# Patient Record
Sex: Female | Born: 1975
Health system: Southern US, Community
[De-identification: ages and names within clinical notes are randomized; demographics above are authoritative.]

## PROBLEM LIST (undated history)

## (undated) DIAGNOSIS — I498 Other specified cardiac arrhythmias: Secondary | ICD-10-CM

## (undated) DIAGNOSIS — I951 Orthostatic hypotension: Secondary | ICD-10-CM

## (undated) DIAGNOSIS — R000 Tachycardia, unspecified: Secondary | ICD-10-CM

## (undated) DIAGNOSIS — G90A Postural orthostatic tachycardia syndrome (POTS): Secondary | ICD-10-CM

## (undated) DIAGNOSIS — IMO0002 Reserved for concepts with insufficient information to code with codable children: Secondary | ICD-10-CM

## (undated) DIAGNOSIS — N35919 Unspecified urethral stricture, male, unspecified site: Secondary | ICD-10-CM

## (undated) DIAGNOSIS — N390 Urinary tract infection, site not specified: Secondary | ICD-10-CM

## (undated) DIAGNOSIS — I809 Phlebitis and thrombophlebitis of unspecified site: Secondary | ICD-10-CM

## (undated) DIAGNOSIS — I4711 Inappropriate sinus tachycardia, so stated: Secondary | ICD-10-CM

## (undated) DIAGNOSIS — Z86718 Personal history of other venous thrombosis and embolism: Secondary | ICD-10-CM

## (undated) DIAGNOSIS — G8929 Other chronic pain: Secondary | ICD-10-CM

## (undated) DIAGNOSIS — M549 Dorsalgia, unspecified: Secondary | ICD-10-CM

## (undated) HISTORY — DX: Postural orthostatic tachycardia syndrome (POTS): G90.A

## (undated) HISTORY — DX: Other specified cardiac arrhythmias: I49.8

## (undated) HISTORY — DX: Tachycardia, unspecified: R00.0

## (undated) HISTORY — DX: Dorsalgia, unspecified: M54.9

## (undated) HISTORY — DX: Other chronic pain: G89.29

## (undated) HISTORY — DX: Unspecified urethral stricture, male, unspecified site: N35.919

## (undated) HISTORY — DX: Personal history of other venous thrombosis and embolism: Z86.718

## (undated) HISTORY — DX: Urinary tract infection, site not specified: N39.0

## (undated) HISTORY — DX: Phlebitis and thrombophlebitis of unspecified site: I80.9

## (undated) HISTORY — DX: Orthostatic hypotension: I95.1

## (undated) HISTORY — DX: Reserved for concepts with insufficient information to code with codable children: IMO0002

## (undated) HISTORY — DX: Inappropriate sinus tachycardia, so stated: I47.11

---

## 2012-11-15 HISTORY — PX: OTHER SURGICAL HISTORY: SHX169

## 2012-12-15 HISTORY — PX: LAMINECTOMY: SHX219

## 2013-03-17 HISTORY — PX: SPINAL FUSION: SHX223

## 2013-05-17 DIAGNOSIS — Z86718 Personal history of other venous thrombosis and embolism: Secondary | ICD-10-CM

## 2013-05-17 HISTORY — PX: OTHER SURGICAL HISTORY: SHX169

## 2013-05-17 HISTORY — DX: Personal history of other venous thrombosis and embolism: Z86.718

## 2015-05-18 LAB — HM PAP SMEAR: HM Pap smear: NEGATIVE

## 2017-11-23 LAB — HM MAMMOGRAPHY

## 2018-03-18 DIAGNOSIS — M9901 Segmental and somatic dysfunction of cervical region: Secondary | ICD-10-CM | POA: Diagnosis not present

## 2018-03-18 DIAGNOSIS — M79605 Pain in left leg: Secondary | ICD-10-CM | POA: Diagnosis not present

## 2018-03-18 DIAGNOSIS — M545 Low back pain: Secondary | ICD-10-CM | POA: Diagnosis not present

## 2018-04-07 ENCOUNTER — Ambulatory Visit: Payer: 59 | Admitting: Adult Health

## 2018-04-07 ENCOUNTER — Telehealth: Payer: Self-pay | Admitting: Adult Health

## 2018-04-07 ENCOUNTER — Encounter: Payer: Self-pay | Admitting: Adult Health

## 2018-04-07 VITALS — BP 98/60 | HR 85 | Temp 98.1°F | Ht 64.0 in | Wt 121.2 lb

## 2018-04-07 DIAGNOSIS — R000 Tachycardia, unspecified: Secondary | ICD-10-CM | POA: Diagnosis not present

## 2018-04-07 DIAGNOSIS — G8929 Other chronic pain: Secondary | ICD-10-CM | POA: Diagnosis not present

## 2018-04-07 DIAGNOSIS — Z7689 Persons encountering health services in other specified circumstances: Secondary | ICD-10-CM

## 2018-04-07 DIAGNOSIS — M545 Low back pain: Secondary | ICD-10-CM

## 2018-04-07 DIAGNOSIS — I951 Orthostatic hypotension: Secondary | ICD-10-CM

## 2018-04-07 DIAGNOSIS — M549 Dorsalgia, unspecified: Secondary | ICD-10-CM

## 2018-04-07 DIAGNOSIS — N35919 Unspecified urethral stricture, male, unspecified site: Secondary | ICD-10-CM | POA: Insufficient documentation

## 2018-04-07 DIAGNOSIS — G90A Postural orthostatic tachycardia syndrome (POTS): Secondary | ICD-10-CM | POA: Insufficient documentation

## 2018-04-07 DIAGNOSIS — N390 Urinary tract infection, site not specified: Secondary | ICD-10-CM | POA: Insufficient documentation

## 2018-04-07 MED ORDER — NITROFURANTOIN MACROCRYSTAL 100 MG PO CAPS: 100.0000 mg | ORAL_CAPSULE | Freq: Every day | ORAL | 3 refills | Status: DC

## 2018-04-07 MED ORDER — OLOPATADINE HCL 0.1 % OP SOLN: 1.0000 [drp] | Freq: Every day | OPHTHALMIC | 1 refills | Status: DC

## 2018-04-07 MED ORDER — GABAPENTIN 300 MG PO CAPS: 900.0000 mg | ORAL_CAPSULE | Freq: Three times a day (TID) | ORAL | 2 refills | Status: DC

## 2018-04-07 NOTE — Telephone Encounter (Signed)
Per Kandee Keenory the gabapentin should be written as 3 capsules TID.  This has been fixed by the pharmacy.  Nothing further is needed

## 2018-04-07 NOTE — Telephone Encounter (Signed)
Rx written take 3 capsules by mouth 3 times a day. Take 2 capsules at dinner and 3 capsules at bedtime.

## 2018-04-07 NOTE — Progress Notes (Signed)
Patient presents to clinic today to establish care. She is a pleasant 42 year old female who  has a past medical history of Frequent UTI, History of DVT (deep vein thrombosis) (05/2013), Inappropriate sinus tachycardia, POTS (postural orthostatic tachycardia syndrome), and Urethral spasm.   Her last CPE was in September 2018   She recently moved from [place] to [place], 5 days ago.   Acute Concerns: Establish Care   Chronic Issues: Inappropriate Sinus Tachycardia - Takes Sectral. Was seen by Cardiology in [place]. She reports being well controlled on this medication. 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   Chronic Pain s/p lumbar fusion/Laminectomy, and Microdiscectomy. Currently prescribed Dilaudid 2 mg every 6 hours, klonopin 0.5 mg BID PRN and Zanaflex for muscle spasms. She also used Gabapentin 900 mg TID  Insomnia -due to pain.  Currently prescribed restoral 15 mg nightly as needed.  She reports that this helps somewhat she is usually awake at 3 AM and is unable to get back to sleep.  She has tried Ambien in the past did not work well for her  History of DVT - happened in 2014 after L5/S1 Fusion.   Health Maintenance: Dental --Routine Care Vision -- Routine Care  Immunizations -- UTD  Colonoscopy -- Never had  Mammogram --2018  PAP -- 2018 - normal  Bone Density -- 2018    Past Medical History:  Diagnosis Date  . Frequent UTI    d/t congential abnormality   . History of DVT (deep vein thrombosis) 05/2013  . Inappropriate sinus tachycardia   . POTS (postural orthostatic tachycardia syndrome)   . Urethral spasm      Current Outpatient Medications on File Prior to Visit  Medication Sig Dispense Refill  . acebutolol (SECTRAL) 200 MG capsule Take 200 mg by mouth daily.    Marland Kitchen amitriptyline (ELAVIL) 25 MG tablet Take 25 mg by mouth at  bedtime.    . clonazePAM (KLONOPIN) 0.5 MG tablet Take 0.5 mg by mouth 2 (two) times daily as needed for anxiety.    . gabapentin (NEURONTIN) 300 MG capsule Take 2 capsules at dinner and then 3 capsules at bedtime.    Marland Kitchen HYDROmorphone (DILAUDID) 2 MG tablet Take 2 mg by mouth every 6 (six) hours as needed for severe pain.    . nitrofurantoin (MACRODANTIN) 100 MG capsule Take 100 mg by mouth daily.    Marland Kitchen olopatadine (PATANOL) 0.1 % ophthalmic solution Place 1 drop into both eyes daily.    . temazepam (RESTORIL) 15 MG capsule Take 15 mg by mouth at bedtime as needed for sleep.    Marland Kitchen tiZANidine (ZANAFLEX) 4 MG capsule Take 4 mg by mouth 3 (three) times daily.     No current facility-administered medications on file prior to visit.     Allergies  Allergen Reactions  . Tamsulosin Hypertension, Photosensitivity, Rash and Shortness Of Breath  . Tramadol Anxiety, Palpitations and Shortness Of Breath  . Cephalexin Hives  . Duloxetine Hcl Other (See Comments)  . Lamotrigine Photosensitivity  . Sulfamethoxazole-Trimethoprim Hives and Palpitations    Family History  Problem Relation Age of Onset  . Early death Mother 71  . Transient ischemic attack Mother   . Depression Mother   . Mental illness Mother   . Transient ischemic attack Father   . Breast cancer Paternal Grandmother   .  Breast cancer Paternal Aunt   . Breast cancer Paternal Aunt     Social History   Socioeconomic History  . Marital status: Married    Spouse name: Not on file  . Number of children: Not on file  . Years of education: Not on file  . Highest education level: Not on file  Occupational History  . Not on file  Social Needs  . Financial resource strain: Not on file  . Food insecurity:    Worry: Not on file    Inability: Not on file  . Transportation needs:    Medical: Not on file    Non-medical: Not on file  Tobacco Use  . Smoking status: Not on file  Substance and Sexual Activity  . Alcohol use: Never     Frequency: Never  . Drug use: Never  . Sexual activity: Not on file  Lifestyle  . Physical activity:    Days per week: Not on file    Minutes per session: Not on file  . Stress: Not on file  Relationships  . Social connections:    Talks on phone: Not on file    Gets together: Not on file    Attends religious service: Not on file    Active member of club or organization: Not on file    Attends meetings of clubs or organizations: Not on file    Relationship status: Not on file  . Intimate partner violence:    Fear of current or ex partner: Not on file    Emotionally abused: Not on file    Physically abused: Not on file    Forced sexual activity: Not on file  Other Topics Concern  . Not on file  Social History Narrative   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    Two children ( 15 & 17)     Review of Systems  Constitutional: Negative.   HENT: Negative.   Respiratory: Negative.   Cardiovascular: Negative.   Gastrointestinal: Negative.   Genitourinary: Negative.   Musculoskeletal: Positive for back pain.  Skin: Negative.   Neurological: Positive for sensory change and weakness.  Psychiatric/Behavioral: The patient has insomnia.   All other systems reviewed and are negative.    BP 98/60 (BP Location: Right Arm, Patient Position: Sitting, Cuff Size: Normal)   Pulse 85   Temp 98.1 F (36.7 C) (Oral)   Ht 5\' 4"  (1.626 m)   Wt 121 lb 4 oz (55 kg)   SpO2 96%   BMI 20.81 kg/m   Physical Exam  Constitutional: She is oriented to person, place, and time. She appears well-developed and well-nourished. No distress.  Appears in pain, unable to sit in exam chair for long periods of time   Eyes: Pupils are equal, round, and reactive to light. EOM are normal.  Cardiovascular: Normal rate, regular rhythm, normal heart sounds and intact distal pulses.  Pulmonary/Chest: Effort normal and breath sounds normal.  Neurological: She is alert and oriented to person, place, and time.    Skin: Skin is warm and dry. Capillary refill takes less than 2 seconds. She is not diaphoretic.  Psychiatric: She has a normal mood and affect. Her behavior is normal. Judgment and thought content normal.  Nursing note and vitals reviewed.  Assessment/Plan: 1. Encounter to establish care - Follow up in September for CPE  - Follow up sooner if needed  2. Urethral spasm  - amitriptyline (ELAVIL) 25 MG tablet; Take 25 mg by  mouth at bedtime.  3. Chronic midline low back pain without sciatica -She was advised that I would not be providing her with any pain medication at this time.  She will need to conserve her pain medication until she is seen at the pain clinic.  She is okay with this plan.  I will also not be prescribing her Klonopin and again this will have to be prescribed by the pain clinic if they feel its appropriate - gabapentin (NEURONTIN) 300 MG capsule; Take 3 capsules (900 mg total) by mouth 3 (three) times daily. Take 2 capsules at dinner and then 3 capsules at bedtime. (Patient taking differently: Take 900 mg by mouth 3 (three) times daily. )  Dispense: 270 capsule; Refill: 2 - Ambulatory referral to Pain Clinic  4. Inappropriate sinus tachycardia  - acebutolol (SECTRAL) 200 MG capsule; Take 200 mg by mouth daily.

## 2018-04-07 NOTE — Telephone Encounter (Signed)
Copied from CRM [id number]. Topic: Quick Communication - Rx Refill/Question >> Apr 07, 2018  3:27 PM Maia Pettiesrtiz, Kristie S wrote: Medication: needing clarification on gabapentin Rx - 2 different sets of directions for use Preferred Pharmacy (with phone number or street name): [id number] [place] - [address] [facility] [address] [facility] [address] [phone] (Phone) [phone] (Fax)

## 2018-04-08 ENCOUNTER — Encounter: Payer: Self-pay | Admitting: Adult Health

## 2018-04-08 ENCOUNTER — Other Ambulatory Visit: Payer: Self-pay | Admitting: Adult Health

## 2018-04-08 MED ORDER — TIZANIDINE HCL 4 MG PO CAPS: 4.0000 mg | ORAL_CAPSULE | Freq: Every day | ORAL | 1 refills | Status: DC

## 2018-04-08 NOTE — Telephone Encounter (Signed)
I stated I didn't take Zanaflex because it didn't really help me. I completely forgot I do take it when I wake up in the middle of the night with perianal spasm. Are you able to refill that?  Cory please advise on the refill of the zanaflex.  Thanks

## 2018-04-10 ENCOUNTER — Encounter: Payer: Self-pay | Admitting: Adult Health

## 2018-04-12 ENCOUNTER — Other Ambulatory Visit: Payer: Self-pay | Admitting: Adult Health

## 2018-04-12 ENCOUNTER — Encounter: Payer: Self-pay | Admitting: Family Medicine

## 2018-04-12 DIAGNOSIS — M545 Low back pain, unspecified: Secondary | ICD-10-CM

## 2018-04-12 DIAGNOSIS — G8929 Other chronic pain: Secondary | ICD-10-CM

## 2018-04-13 ENCOUNTER — Ambulatory Visit: Payer: 59 | Admitting: Sports Medicine

## 2018-04-13 ENCOUNTER — Other Ambulatory Visit: Payer: Self-pay | Admitting: Adult Health

## 2018-04-13 ENCOUNTER — Ambulatory Visit (INDEPENDENT_AMBULATORY_CARE_PROVIDER_SITE_OTHER): Payer: 59

## 2018-04-13 ENCOUNTER — Encounter: Payer: Self-pay | Admitting: Sports Medicine

## 2018-04-13 VITALS — BP 120/78 | HR 98 | Ht 64.0 in | Wt 123.0 lb

## 2018-04-13 DIAGNOSIS — M961 Postlaminectomy syndrome, not elsewhere classified: Secondary | ICD-10-CM

## 2018-04-13 DIAGNOSIS — M9908 Segmental and somatic dysfunction of rib cage: Secondary | ICD-10-CM

## 2018-04-13 DIAGNOSIS — M545 Low back pain: Secondary | ICD-10-CM | POA: Diagnosis not present

## 2018-04-13 DIAGNOSIS — G8929 Other chronic pain: Secondary | ICD-10-CM

## 2018-04-13 DIAGNOSIS — M9902 Segmental and somatic dysfunction of thoracic region: Secondary | ICD-10-CM | POA: Diagnosis not present

## 2018-04-13 DIAGNOSIS — M9903 Segmental and somatic dysfunction of lumbar region: Secondary | ICD-10-CM

## 2018-04-13 DIAGNOSIS — M9905 Segmental and somatic dysfunction of pelvic region: Secondary | ICD-10-CM

## 2018-04-13 DIAGNOSIS — M47816 Spondylosis without myelopathy or radiculopathy, lumbar region: Secondary | ICD-10-CM | POA: Diagnosis not present

## 2018-04-13 DIAGNOSIS — M9904 Segmental and somatic dysfunction of sacral region: Secondary | ICD-10-CM

## 2018-04-13 MED ORDER — HYDROMORPHONE HCL 2 MG PO TABS: 2.0000 mg | ORAL_TABLET | Freq: Four times a day (QID) | ORAL | 0 refills | Status: AC | PRN

## 2018-04-13 MED ORDER — ALCAFTADINE 0.25 % OP SOLN: 1.0000 [drp] | Freq: Every day | OPHTHALMIC | 3 refills | Status: DC

## 2018-04-13 MED ORDER — IBUPROFEN-FAMOTIDINE 800-26.6 MG PO TABS: 1.0000 | ORAL_TABLET | Freq: Three times a day (TID) | ORAL | 0 refills | Status: AC | PRN

## 2018-04-13 NOTE — Progress Notes (Signed)
Christie Hurst. [facility] [phone]  Christie Hurst - 42 y.o. female MRN [id number]  Date of birth: 23-May-1976  Visit Date: 04/13/2018  PCP: Shirline Frees, NP   Referred by: Shirline Frees, NP  Scribe(s) for today's visit: Christoper Fabian, LAT, ATC  SUBJECTIVE:  Christie Hurst is here for New Patient (Initial Visit) (Low back pain) .  Referred by: Angela Adam, NP   Her Back pain into left leg symptoms INITIALLY:  Began 5 years after surgery and has not changed much over time Described as moderate pain, radiating to back of leg and groin.  Worsened with movement back. Leg pain is worse during the night.  Improved with some home exercises and manipulation from provider in [place]. She also has over the counter creams that have helped in the past.   Additional associated symptoms include: numbness in legs.     At this time symptoms show no change compared to onset  She has tried NSAIDs but they did not improve pain.  Injection right SI joint side effects were so bad patient would not like to try any more. Therapy. Patient has tried physical therapy a few times and it only increases the muscle spasms and she has to stop She does have open referral to pain clinic but an appointment has not been made yet.    REVIEW OF SYSTEMS: Reports night time disturbances.Pain in back and left leg wake up at night  Denies fevers, chills, or night sweats. Denies unexplained weight loss. Denies personal history of cancer. Denies changes in bowel or bladder habits. Denies recent unreported falls. Denies new or worsening dyspnea or wheezing. Denies headaches or dizziness.  Reports numbness, tingling or weakness  In the extremities. Left let history of nerve damage.  Denies dizziness or presyncopal episodes Denies lower extremity edema     HISTORY & PERTINENT PRIOR DATA:  Significant/pertinent history, findings, studies include:  reports that she has never smoked. She has never used smokeless tobacco. No results for input(s): HGBA1C, LABURIC, CREATINE in the last 8760 hours. No specialty comments available. No problems updated.  Otherwise prior history reviewed and updated per electronic medical record.    OBJECTIVE:  VS:  HT:5\' 4"  (162.6 cm)   WT:123 lb (55.8 kg)  BMI:21.1    BP:120/78  HR:98bpm  TEMP: ( )  RESP:100 %   PHYSICAL EXAM: CONSTITUTIONAL: Well-developed, Well-nourished and In no acute distress Alert & appropriately interactive. and Not depressed or anxious appearing. RESPIRATORY: No increased work of breathing and Trachea Midline EYES: Pupils are equal., EOM intact without nystagmus. and No scleral icterus.  Lower extremities: Warm and well perfused Edema: No significant swelling or edema NEURO: unremarkable Normal associated myotomal distribution strength to manual muscle testing Normal sensation to light touch Normal and symmetric associated DTRs  MSK Exam: BACK Exam: Normal alignment & Contours Skin: No overlying erythema/ecchymosis  MOTOR TESTING: Intact in all LE myotomes and Able to heel and toe walk without difficutly        RIGHT    LEFT Straight leg raise-------------------------: positive, moderate pain                         positive, moderate pain Braggard Stretch Test------------------: positive, mild pain                         normal, no pain Slump Sign--------------------------------: positive,  mild pain                         positive, mild pain Popliteal compression test------------: normal, no pain                         normal, no pain Greater sciatic notch tenderness----: positive, mild pain                         positive, mild pain   REFLEXES Right Left  DTR - L3/4 -Patellar 2+ 2+  DTR - L5/S1 - Achilles trace trace     PROCEDURES & DATA REVIEWED:  . Osteopathic manipulation was performed today based on physical exam findings.  Please see  procedure note for further information including Osteopathic Exam findings . X-Rays obtained today and reviewed with the patient that showed: Overall good alignment of her lumbar spine with prior  L5-S1 fusion with hardware that appear to be intact.  ASSESSMENT   1. Failed back syndrome of lumbar spine   2. Chronic midline low back pain without sciatica     PLAN:   Rest the injured area as much as practical Apply ice packs Apply heat (do not to sleep on heating pad) prescription for NSAID given    . Please see procedure section and notes. . Links to [employer] videos provided today per Patient Instructions.  These exercises were developed by Myles LippsEric Goodman, DC with a strong emphasis on core neuromuscular reducation and postural realignment through body-weight exercises.  . One-time only prescription for #20 Dilaudid provided until she is able to establish and pain management.  She had a slight exacerbation in some of her mechanical low back symptoms with osteopathic manipulation but improvement in the radicular pain she was having.  We did discuss that no further refills will be provided and that this will need to come from pain clinic going forward but the [place] on a controlled substance database was checked and did not return for any results.  She uses these only very intermittently but has responded well to osteopathic manipulation in the past. No problem-specific Assessment & Plan notes found for this encounter.  Follow-up: Return in about 2 weeks (around 04/27/2018) for consideration of repeat Osteopathic Manipulation.      Please see additional documentation for Objective, Assessment and Plan sections. Pertinent additional documentation may be included in corresponding procedure notes, imaging studies, problem based documentation and patient instructions. Please see these sections of the encounter for additional information regarding this visit.  CMA/ATC served as Neurosurgeonscribe during  this visit. History, Physical, and Plan performed by medical provider. Documentation and orders reviewed and attested to.      Andrena MewsMichael D Shamyah Stantz, DO    [facility]

## 2018-04-13 NOTE — Patient Instructions (Addendum)

## 2018-04-17 ENCOUNTER — Encounter: Payer: Self-pay | Admitting: Sports Medicine

## 2018-04-17 NOTE — Progress Notes (Signed)
PROCEDURE NOTE : OSTEOPATHIC MANIPULATION The decision today to treat with Osteopathic Manipulative Therapy (OMT) was based on physical exam findings. Verbal consent was obtained following a discussion with the patient regarding the of risks, benefits and potential side effects, including an acute pain flare,post manipulation soreness and need for repeat treatments.     Contraindications to OMT: NONE and Prior L5-S1 fusion, HVLA was avoided in this area.  Manipulation was performed as below: Regions treated: Thoracic spine, Ribs, Lumbar spine, Pelvis and Sacrum OMT Techniques Used: HVLA, muscle energy, myofascial release, articulatory, counterstrain and facilitated positional release  The patient tolerated the treatment well and reported Improved symptoms following treatment today. Patient was given medications, exercises, stretches and lifestyle modifications per AVS and verbally.   OSTEOPATHIC/STRUCTURAL EXAM:   T4 -8 Neutral, Rotated LEFT, Sidebent RIGHT Rib 7 Left  Posterior L3 FRS right (Flexed, Rotated & Sidebent) Right psoas spasm Right anterior innonimate L on L sacral torsion

## 2018-04-19 ENCOUNTER — Other Ambulatory Visit: Payer: Self-pay | Admitting: Adult Health

## 2018-04-19 ENCOUNTER — Encounter: Payer: Self-pay | Admitting: Adult Health

## 2018-04-19 DIAGNOSIS — G8929 Other chronic pain: Secondary | ICD-10-CM

## 2018-04-19 DIAGNOSIS — M545 Low back pain: Principal | ICD-10-CM

## 2018-04-19 DIAGNOSIS — G4700 Insomnia, unspecified: Secondary | ICD-10-CM

## 2018-04-19 MED ORDER — TEMAZEPAM 15 MG PO CAPS: 15.0000 mg | ORAL_CAPSULE | Freq: Every evening | ORAL | 1 refills | Status: DC | PRN

## 2018-04-28 ENCOUNTER — Ambulatory Visit: Payer: 59 | Admitting: Sports Medicine

## 2018-05-05 DIAGNOSIS — M961 Postlaminectomy syndrome, not elsewhere classified: Secondary | ICD-10-CM | POA: Diagnosis not present

## 2018-05-05 DIAGNOSIS — M129 Arthropathy, unspecified: Secondary | ICD-10-CM | POA: Diagnosis not present

## 2018-05-05 DIAGNOSIS — Z79899 Other long term (current) drug therapy: Secondary | ICD-10-CM | POA: Diagnosis not present

## 2018-05-06 ENCOUNTER — Ambulatory Visit: Payer: Self-pay

## 2018-05-06 ENCOUNTER — Ambulatory Visit: Payer: 59 | Admitting: Sports Medicine

## 2018-05-06 ENCOUNTER — Encounter: Payer: Self-pay | Admitting: Sports Medicine

## 2018-05-06 VITALS — BP 102/76 | HR 83 | Ht 64.0 in | Wt 125.2 lb

## 2018-05-06 DIAGNOSIS — M9902 Segmental and somatic dysfunction of thoracic region: Secondary | ICD-10-CM | POA: Diagnosis not present

## 2018-05-06 DIAGNOSIS — Q079 Congenital malformation of nervous system, unspecified: Secondary | ICD-10-CM | POA: Diagnosis not present

## 2018-05-06 DIAGNOSIS — M545 Low back pain, unspecified: Secondary | ICD-10-CM

## 2018-05-06 DIAGNOSIS — M9905 Segmental and somatic dysfunction of pelvic region: Secondary | ICD-10-CM

## 2018-05-06 DIAGNOSIS — M9904 Segmental and somatic dysfunction of sacral region: Secondary | ICD-10-CM

## 2018-05-06 DIAGNOSIS — G8929 Other chronic pain: Secondary | ICD-10-CM

## 2018-05-06 DIAGNOSIS — M9903 Segmental and somatic dysfunction of lumbar region: Secondary | ICD-10-CM

## 2018-05-06 DIAGNOSIS — M961 Postlaminectomy syndrome, not elsewhere classified: Secondary | ICD-10-CM

## 2018-05-06 DIAGNOSIS — M9908 Segmental and somatic dysfunction of rib cage: Secondary | ICD-10-CM

## 2018-05-06 NOTE — Progress Notes (Signed)
Christie Hurst. Christie Hurst [facility] [phone]  NICKAYLA Hurst - 42 y.o. female MRN [id number]  Date of birth: 1976-06-21  Visit Date: 05/06/2018  PCP: Christie Frees, NP   Referred by: Christie Frees, NP  Scribe(s) for today's visit: Stevenson Clinch, CMA  SUBJECTIVE:  Christie Hurst is here for Follow-up (LBP) .  Referred by: Christie Adam, NP   HPI: 04/13/2018: Her Back pain into left leg symptoms INITIALLY:  Began 5 years after surgery and has not changed much over time Described as moderate pain, radiating to back of leg and groin.  Worsened with movement back. Leg pain is worse during the night.  Improved with some home exercises and manipulation from provider in [place]. She also has over the counter creams that have helped in the past.   Additional associated symptoms include: numbness in legs.    At this time symptoms show no change compared to onset  She has tried NSAIDs but they did not improve pain.  Injection right SI joint side effects were so bad patient would not like to try any more. Therapy. Patient has tried physical therapy a few times and it only increases the muscle spasms and she has to stop She does have open referral to pain clinic but an appointment has not been made yet.   05/06/2018: Compared to the last office visit, her previously described symptoms show no change. She reports flare up in L leg after OMT, "charlie horse and nerve pain". She reports increased swelling in her ankles which seems to have worsened since she moved here in Aug. She has hx of DVT in L leg. She denies increased warmth or redness in R leg. Sx are exacerbated by activity.  New sx - "nerve pain" on the bottom of the R foot. She is unable to walk around without shoes. She noticed some bruising on her arms and legs.  Current symptoms are moderate & are radiating to the L leg.  She received OMT after her last visit and reports severe  pain after OMT. She noticed some relief in the R side of her lower back. She had a lot of hip pain after OMT. Pain was severe enough to keep her up all night.  She has d/c Gabapentin per pain specialist Shaaron Adler at [facility]), plans to start on Belbuca. She will continue with Dilaudid which is the only medication that seems to help at all.   REVIEW OF SYSTEMS: Reports night time disturbances.Pain in back and left leg wake up at night  Denies fevers, chills, or night sweats. Denies unexplained weight loss. Denies personal history of cancer. Denies changes in bowel or bladder habits. Denies recent unreported falls. Denies new or worsening dyspnea or wheezing. Denies headaches or dizziness.  Reports numbness, tingling or weakness  In the extremities. Left let history of nerve damage.  Denies dizziness or presyncopal episodes Reports lower extremity edema - swelling in ankles with warm weather, L>R.    HISTORY:  Prior history reviewed and updated per electronic medical record.  Social History   Occupational History  . Not on file  Tobacco Use  . Smoking status: Never Smoker  . Smokeless tobacco: Never Used  Substance and Sexual Activity  . Alcohol use: Never    Frequency: Never  . Drug use: Never  . Sexual activity: Not on file   Social History   Social History Narrative   Nurse - on disability  Married 17 years    Two children ( 15 & 8517)     Past Medical History:  Diagnosis Date  . Chronic back pain   . Frequent UTI    d/t congential abnormality   . History of DVT (deep vein thrombosis) 05/2013  . Inappropriate sinus tachycardia   . Phlebitis   . POTS (postural orthostatic tachycardia syndrome)   . Urethral spasm   . UTI (urinary tract infection)    Past Surgical History:  Procedure Laterality Date  . CESAREAN SECTION     x 2   . LAMINECTOMY  12/2012   L5/S1  . microdiscetomy  11/2012   L5/S1  . SPINAL FUSION  03/2013   L5/S1  . stent/DVT   05/2013   Left groin   family history includes Breast cancer in her paternal aunt, paternal aunt, and paternal grandmother; Depression in her mother; Early death (age of onset: 6867) in her mother; Mental illness in her mother; Transient ischemic attack in her father and mother.  DATA OBTAINED & REVIEWED:  No results for input(s): HGBA1C, LABURIC, CREATINE in the last 8760 hours. . 04/12/2013: Underwent posterior lumbar interbody fusion (PLIF) at L5-S1 with bone grafting this was a revision of multiple surgeries. o It is noted that she does have a conjoined nerve root in the preoperative diagnosis from the operative report on 04/12/2013 . Nerve conduction studies on 10/26/2016: Showed nonacute left S1 radiculopathy . 09/28/2016: MRI lumbar spine without contrast o Postoperative changes after PLIF at L5-S1 with left paracentral protruding disc/osteophyte complex, and probable left foraminal narrowing.  Artifact limits the interpretation however o Degenerative changes at L4-5 where there is a right foraminal disc protrusion and osteophyte complex suprapineal disc bulge. . 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   Otherwise prior history reviewed and updated per electronic medical record.    OBJECTIVE:  VS:  HT:5\' 4"  (162.6 cm)   WT:125 lb 3.2 oz (56.8 kg)  BMI:21.48    BP:102/76  HR:83bpm  TEMP: ( )  RESP:100 %   PHYSICAL EXAM: CONSTITUTIONAL: Well-developed, Well-nourished and In no acute distress Psychiatric: Alert & appropriately interactive. and Not depressed or anxious appearing. RESPIRATORY: No increased work of breathing and Trachea Midline EYES: Pupils are equal., EOM intact without nystagmus. and No scleral icterus.  Lower extremities: EXTREMITY EXAM: Warm and well perfused Edema: No significant swelling or  edema NEURO: unremarkable Normal associated myotomal distribution strength to manual muscle testing Normal sensation to light touch Normal and symmetric associated DTRs  MSK Exam: BACK Exam: Normal alignment & Contours Skin: No overlying erythema/ecchymosis  MOTOR TESTING: Intact in all LE myotomes and Able to heel and toe walk without difficutly        RIGHT    LEFT Straight leg raise-------------------------: positive, mild pain                         positive, moderate pain Braggard Stretch Test------------------: positive, mild pain                         positive, moderate pain Slump Sign--------------------------------: positive, mild pain                         positive, mild pain Popliteal compression test------------:  normal, no pain                         normal, no pain Greater sciatic notch tenderness----: positive, mild pain                         positive, mild pain   REFLEXES Right Left  DTR - L3/4 -Patellar 2+ 2+  DTR - L5/S1 - Achilles trace trace     ASSESSMENT   1. Low back pain, unspecified back pain laterality, unspecified chronicity, with sciatica presence unspecified   2. Failed back syndrome of lumbar spine   3. 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 Chronic midline low back pain without sciatica   5. Somatic dysfunction of thoracic region   6. Somatic dysfunction of lumbar region   7. Somatic dysfunction of rib cage region   8. Somatic dysfunction of pelvis region   9. Somatic dysfunction of sacral region     PLAN:  Pertinent additional documentation may be included in corresponding procedure notes, imaging studies, problem based documentation and patient instructions.  Procedures:  . Osteopathic manipulation was performed today based on physical exam findings.  Please see procedure note for further information including Osteopathic Exam findings  Medications:  No orders of the defined types were placed in  this encounter.  Discussion/Instructions: No problem-specific Assessment & Plan notes found for this encounter.  Marland Kitchen Extensive review of records performed today. Marland Kitchen Ultimately do show that she has a chronic S1 radiculopathy is seen on nerve conduction studies on 10/26/2016 and there are MRI findings that are seen most recently on September 28, 2016 MRI it shows a likely left foraminal narrowing.  She did not tolerate epidural steroid injections previously and I do not information as to what medication was used but this could be considered again. . She reports the epidural side effects included flushing for several months.  This was significantly worse status post injection and it was when she was on systemic steroids which is quite interesting. . Continue previously prescribed home exercise program.  . She has been started on long-acting buprenorphine monotherapy through pain management so she can still take the Dilaudid for breakthrough pain.  Will defer to their expertise regarding further management of her medications . Discussed red flag symptoms that warrant earlier emergent evaluation and patient voices understanding. . Activity modifications and the importance of avoiding exacerbating activities (limiting pain to no more than a 4 / 10 during or following activity) recommended and discussed.  Follow-up:  . Return in about 3 weeks (around 05/27/2018) for consideration of repeat Osteopathic Manipulation.   . If any lack of improvement consider: Repeat epidural steroid injection given findings on most recent MRI.  See above for considerations regarding epidural.  . At follow up will plan to consider: Repeat osteopathic manipulation or if persistent worsening symptoms could consider repeat epidural steroid injection given findings on MRI and nerve conduction studies.  She is not interested in repeat surgery however it does likely appear that she has some nerve root impingement.  We did discuss that a  CT myelogram could be considered but the risk of post LP headache some and she would like to avoid.     CMA/ATC served as Neurosurgeon during this visit. History, Physical, and Plan performed by medical provider. Documentation and orders reviewed and attested to.      Andrena Mews, DO  [facility] Physician

## 2018-05-08 NOTE — Progress Notes (Signed)
PROCEDURE NOTE : OSTEOPATHIC MANIPULATION The decision today to treat with Osteopathic Manipulative Therapy (OMT) was based on physical exam findings. Verbal consent was obtained following a discussion with the patient regarding the of risks, benefits and potential side effects, including an acute pain flare,post manipulation soreness and need for repeat treatments.     Contraindications to OMT: NONE and Prior L5-S1 fusion, HVLA was avoided in this area.   Manipulation was performed as below: Regions Treated OMT Techniques Used  Thoracic spine Ribs Lumbar spine Pelvis Sacrum HVLA muscle energy myofascial release articulatory soft tissue counterstrain   The patient tolerated the treatment well and reported Improved symptoms following treatment today. Patient was given medications, exercises, stretches and lifestyle modifications per AVS and verbally.   OSTEOPATHIC/STRUCTURAL EXAM:   T4 -8 Neutral, Rotated LEFT, Sidebent RIGHT Rib 7 Left  Posterior L3 FRS right (Flexed, Rotated & Sidebent) Left psoas spasm Left anterior innonimate L on L sacral torsion

## 2018-05-16 ENCOUNTER — Encounter: Payer: Self-pay | Admitting: Physical Therapy

## 2018-05-16 ENCOUNTER — Ambulatory Visit: Payer: 59 | Admitting: Physical Therapy

## 2018-05-16 DIAGNOSIS — M5442 Lumbago with sciatica, left side: Secondary | ICD-10-CM

## 2018-05-16 DIAGNOSIS — G8929 Other chronic pain: Secondary | ICD-10-CM

## 2018-05-17 ENCOUNTER — Encounter: Payer: Self-pay | Admitting: Physical Therapy

## 2018-05-17 NOTE — Therapy (Signed)
[facility] [address] [place], [place], [address] Phone: [phone]   Fax:  [phone]  Physical Therapy Evaluation  Patient Details  Name: Christie Hurst MRN: [id number] Date of Birth: 09/07/1975 Referring Provider (PT): Gaspar Bidding   Encounter Date: 05/16/2018  PT End of Session - 05/17/18 1355    Visit Number  1    Number of Visits  12    Date for PT Re-Evaluation  06/27/18    Authorization Type  [employer]    PT Start Time  1303    PT Stop Time  1345    PT Time Calculation (min)  42 min    Activity Tolerance  Patient tolerated treatment well;Patient limited by pain    Behavior During Therapy  [facility] for tasks assessed/performed       Past Medical History:  Diagnosis Date  . Chronic back pain   . Frequent UTI    d/t congential abnormality   . History of DVT (deep vein thrombosis) 05/2013  . Inappropriate sinus tachycardia   . Phlebitis   . POTS (postural orthostatic tachycardia syndrome)   . Urethral spasm   . UTI (urinary tract infection)     Past Surgical History:  Procedure Laterality Date  . CESAREAN SECTION     x 2   . LAMINECTOMY  12/2012   L5/S1  . microdiscetomy  11/2012   L5/S1  . SPINAL FUSION  03/2013   L5/S1  . stent/DVT  05/2013   Left groin    There were no vitals filed for this visit.   Subjective Assessment - 05/16/18 1311    Subjective  Pt had 3 back surgeries in 2014, has had problems and pain since then. She does not work, due to pain and has not been able to do any physical activities or HEP due to pain. Tried PT last year, states that every time she tries to increase physical activity, she has increased spasms and pain. She appears to be frustrated with her current situation of continuous pain. She states Shooting pains down L LE, and also pain in bil low lumbar region. She was seen previously at other MD offices in [place], moved here recently. Previously was very active outdoor sports,  working as Engineer, civil (consulting), Catering manager.     Limitations  Sitting;Reading;Lifting;Walking;Standing;House hold activities    Patient Stated Goals  Decreased pain    Currently in Pain?  Yes    Pain Score  5     Pain Location  Back    Pain Orientation  Right;Left    Pain Descriptors / Indicators  Burning;Shooting;Sharp    Pain Type  Chronic pain    Pain Onset  More than a month ago    Pain Frequency  Constant    Aggravating Factors   Standing, walking, being upright/increased load.     Pain Relieving Factors  none    Multiple Pain Sites  Yes    Pain Score  7    Pain Location  Leg    Pain Orientation  Left    Pain Descriptors / Indicators  Burning    Pain Type  Chronic pain    Pain Onset  More than a month ago    Pain Frequency  Constant    Aggravating Factors   Increased activity, standing, walking.          [facility] PT Assessment - 05/17/18 0001      Assessment   Medical Diagnosis  Low Back Pain  Referring Provider (PT)  Gaspar Bidding    Prior Therapy  in 2014 after surgery       Precautions   Precautions  Back      Balance Screen   Has the patient fallen in the past 6 months  No      Prior Function   Level of Independence  Independent   Independent, but has signficant pain with most activities.      Cognition   Overall Cognitive Status  Within Functional Limits for tasks assessed      ROM / Strength   AROM / PROM / Strength  AROM;Strength      AROM   Overall AROM Comments  Lumbar Flexion: mod limitation;  ext: mild limitation/ pain;  SB: mod limitation: Pain on L;  Hip ROM: mild /mod limitations for flex/ext and rotation bil;       Strength   Overall Strength Comments  Hips: 4+/5 ;  Core: 3/5       Palpation   Palpation comment  Tightness and tenderness in bil lumbar paraspinals,  Bil QL;  tightness in bil hamstrings; Tenderness and trigger points in L glute;       Special Tests   Other special tests  + LLTT on L;       Ambulation/Gait   Gait Comments  Very stiff upper body  posture with ambulation, minimal thoracic rotation; decreased hip extension;       Functional Gait  Assessment   Gait assessed   --                Objective measurements completed on examination: See above findings.              PT Education - 05/17/18 1355    Education Details  PT POC,     Person(s) Educated  Patient    Methods  Explanation    Comprehension  Verbalized understanding       PT Short Term Goals - 05/17/18 1358      PT SHORT TERM GOAL #1   Title  Pt to report decreased pain in L LE and lumbar region, to 5/10 with activity     Time  3    Period  Weeks    Status  New    Target Date  06/06/18      PT SHORT TERM GOAL #2   Title  Pt to be independent with initial HEP     Time  2    Period  Weeks    Status  New    Target Date  05/30/18        PT Long Term Goals - 05/17/18 1359      PT LONG TERM GOAL #1   Title  Pt to be independent with final HEp for lumbar spine     Time  6    Period  Weeks    Status  New    Target Date  06/27/18      PT LONG TERM GOAL #2   Title  Pt to demo improved ROM for lumar spine, to be WNL for pt age    Time  6    Period  Weeks    Status  New    Target Date  06/27/18      PT LONG TERM GOAL #3   Title  Pt to report decreased pain in L LE and lumbar region, to 0-3/10 with activity, to improve ablility for IADLs and ADLS.  Time  6    Period  Weeks    Status  New    Target Date  06/27/18      PT LONG TERM GOAL #4   Title  Pt to report increased abiity for standing/walking, for up to 1 mile, with pain <3/10, to improve ability for community navigation.     Time  6    Period  Weeks    Status  New    Target Date  06/27/18             Plan - 05/17/18 1402    Clinical Impression Statement  Pt presents with primary complaint of increased pain in low back bilaterally, as well as down L LE. Pt with tightness of lumbar, glutes, and hamstring musculature. She has increased muslce tension in L LE. She  also has poor stability of core and hips, and will benefit from strengthening for this. Pt with significant decrease in ability for standing, walking, and functional activity, due to significant pain. Pt very limited with ability and time being upright, with decreased ability for ADLs and IADLs. Pt to benefit from skilled PT to improve deficits and pain.     Clinical Presentation  Stable    Clinical Decision Making  Moderate    Rehab Potential  Good    PT Frequency  2x / week    PT Duration  6 weeks    PT Treatment/Interventions  ADLs/Self Care Home Management;Cryotherapy;Electrical Stimulation;Iontophoresis 4mg /ml Dexamethasone;Functional mobility training;Stair training;Gait training;Ultrasound;Moist Heat;Therapeutic activities;Therapeutic exercise;Balance training;Neuromuscular re-education;Patient/family education;Passive range of motion;Manual techniques;Dry needling;Taping    PT Next Visit Plan  Teach HEP     Consulted and Agree with Plan of Care  Patient       Patient will benefit from skilled therapeutic intervention in order to improve the following deficits and impairments:  Abnormal gait, Decreased range of motion, Increased muscle spasms, Decreased activity tolerance, Pain, Improper body mechanics, Impaired flexibility, Hypomobility, Decreased mobility, Decreased strength, Difficulty walking  Visit Diagnosis: Chronic bilateral low back pain with left-sided sciatica     Problem List Patient Active Problem List   Diagnosis Date Noted  . Failed back syndrome of lumbar spine 04/13/2018  . Urethral spasm   . Chronic back pain   . POTS (postural orthostatic tachycardia syndrome)   . Inappropriate sinus tachycardia   . Frequent UTI   . History of DVT (deep vein thrombosis) 05/17/2013    Sedalia Muta, PT, DPT 2:15 PM  05/17/18    [facility] [address] [place], [place], [address] Phone: [phone]   Fax:   [phone]  Name: Christie Hurst MRN: [id number] Date of Birth: 29-Nov-1975

## 2018-05-19 DIAGNOSIS — Z79899 Other long term (current) drug therapy: Secondary | ICD-10-CM | POA: Diagnosis not present

## 2018-05-19 DIAGNOSIS — Z23 Encounter for immunization: Secondary | ICD-10-CM | POA: Diagnosis not present

## 2018-05-19 DIAGNOSIS — G894 Chronic pain syndrome: Secondary | ICD-10-CM | POA: Diagnosis not present

## 2018-05-19 DIAGNOSIS — M961 Postlaminectomy syndrome, not elsewhere classified: Secondary | ICD-10-CM | POA: Diagnosis not present

## 2018-05-24 ENCOUNTER — Encounter: Payer: Self-pay | Admitting: Physical Therapy

## 2018-05-24 ENCOUNTER — Ambulatory Visit: Payer: 59 | Admitting: Physical Therapy

## 2018-05-24 DIAGNOSIS — M5442 Lumbago with sciatica, left side: Secondary | ICD-10-CM

## 2018-05-24 DIAGNOSIS — G8929 Other chronic pain: Secondary | ICD-10-CM | POA: Diagnosis not present

## 2018-05-24 NOTE — Patient Instructions (Signed)
Access Code: [email or web address]  URL: [email or web address]  Date: 05/24/2018  Prepared by: Sedalia Muta   Exercises  Supine Single Knee to Chest - 3 reps - 30 hold - 2x daily  Seated Hamstring Stretch - 3 reps - 30 hold - 2x daily  Standing Sidebending with Chair Support - 3 reps - 30 hold - 2x daily  Child's Pose Stretch - 3 reps - 30 hold - 2x daily  Supine Posterior Pelvic Tilt - 10 reps - 2 sets - 2x daily

## 2018-05-24 NOTE — Therapy (Signed)
[facility] [address] [place], [place], [address] Phone: [phone]   Fax:  [phone]  Physical Therapy Treatment  Patient Details  Name: Christie Hurst MRN: [id number] Date of Birth: 1976/04/04 Referring Provider (PT): Gaspar Bidding   Encounter Date: 05/24/2018  PT End of Session - 05/24/18 1025    Visit Number  2    Number of Visits  12    Date for PT Re-Evaluation  06/27/18    Authorization Type  UHC    PT Start Time  1020    PT Stop Time  1105    PT Time Calculation (min)  45 min    Activity Tolerance  Patient tolerated treatment well;Patient limited by pain    Behavior During Therapy  [facility] for tasks assessed/performed       Past Medical History:  Diagnosis Date  . Chronic back pain   . Frequent UTI    d/t congential abnormality   . History of DVT (deep vein thrombosis) 05/2013  . Inappropriate sinus tachycardia   . Phlebitis   . POTS (postural orthostatic tachycardia syndrome)   . Urethral spasm   . UTI (urinary tract infection)     Past Surgical History:  Procedure Laterality Date  . CESAREAN SECTION     x 2   . LAMINECTOMY  12/2012   L5/S1  . microdiscetomy  11/2012   L5/S1  . SPINAL FUSION  03/2013   L5/S1  . stent/DVT  05/2013   Left groin    There were no vitals filed for this visit.  Subjective Assessment - 05/24/18 1024    Subjective  Pt states that she was up a lot in the night, with nerve pain on L.     Currently in Pain?  Yes    Pain Score  4     Pain Location  Back    Pain Orientation  Right;Left    Pain Descriptors / Indicators  Burning;Shooting;Sharp    Pain Type  Chronic pain    Pain Onset  More than a month ago    Pain Frequency  Constant    Pain Score  6    Pain Location  Leg    Pain Orientation  Left    Pain Descriptors / Indicators  Burning    Pain Type  Chronic pain    Pain Onset  More than a month ago    Pain Frequency  Constant                        [facility] Adult PT Treatment/Exercise - 05/24/18 0001      Exercises   Exercises  Lumbar      Lumbar Exercises: Stretches   Active Hamstring Stretch  3 reps;30 seconds    Single Knee to Chest Stretch  3 reps;30 seconds    Pelvic Tilt  20 reps    Other Lumbar Stretch Exercise  Standing QL stretch 30 sec x3 bil; childs pose 30 sec x3;       Lumbar Exercises: Aerobic   Stationary Bike  L1 x 7 min      Lumbar Exercises: Supine   Ab Set  10 reps;5 seconds      Manual Therapy   Manual Therapy  Soft tissue mobilization;Passive ROM;Manual Traction;Joint mobilization    Joint Mobilization  R post and inf hip mobs;  hip distraction bilaterally    Soft tissue mobilization  STM/DTM and IASTM to bil lumbar paraspinals  and QL    Passive ROM  L Hamstring stretch with ankle pump for nerve glide    Manual Traction  light long leg distraction on L , 10 sec x5;                PT Short Term Goals - 05/17/18 1358      PT SHORT TERM GOAL #1   Title  Pt to report decreased pain in L LE and lumbar region, to 5/10 with activity     Time  3    Period  Weeks    Status  New    Target Date  06/06/18      PT SHORT TERM GOAL #2   Title  Pt to be independent with initial HEP     Time  2    Period  Weeks    Status  New    Target Date  05/30/18        PT Long Term Goals - 05/17/18 1359      PT LONG TERM GOAL #1   Title  Pt to be independent with final HEp for lumbar spine     Time  6    Period  Weeks    Status  New    Target Date  06/27/18      PT LONG TERM GOAL #2   Title  Pt to demo improved ROM for lumar spine, to be WNL for pt age    Time  6    Period  Weeks    Status  New    Target Date  06/27/18      PT LONG TERM GOAL #3   Title  Pt to report decreased pain in L LE and lumbar region, to 0-3/10 with activity, to improve ablility for IADLs and ADLS.     Time  6    Period  Weeks    Status  New    Target Date  06/27/18      PT LONG TERM  GOAL #4   Title  Pt to report increased abiity for standing/walking, for up to 1 mile, with pain <3/10, to improve ability for community navigation.     Time  6    Period  Weeks    Status  New    Target Date  06/27/18            Plan - 05/24/18 1040    Clinical Impression Statement  Pt with noted tightness in R hip and R QL > L with ther ex and manual today. Pt will benefit from continued education on HEP. Plan to work on hip flexor tightness next visit, and progress HEP as tolerated. Pt with noted nerve tension in L LE vs R with activities today.       Rehab Potential  Good    PT Frequency  2x / week    PT Duration  6 weeks    PT Treatment/Interventions  ADLs/Self Care Home Management;Cryotherapy;Electrical Stimulation;Iontophoresis 4mg /ml Dexamethasone;Functional mobility training;Stair training;Gait training;Ultrasound;Moist Heat;Therapeutic activities;Therapeutic exercise;Balance training;Neuromuscular re-education;Patient/family education;Passive range of motion;Manual techniques;Dry needling;Taping    PT Next Visit Plan  Teach HEP     Consulted and Agree with Plan of Care  Patient       Patient will benefit from skilled therapeutic intervention in order to improve the following deficits and impairments:  Abnormal gait, Decreased range of motion, Increased muscle spasms, Decreased activity tolerance, Pain, Improper body mechanics, Impaired flexibility, Hypomobility, Decreased mobility, Decreased strength, Difficulty walking  Visit Diagnosis: Chronic bilateral low back pain with left-sided sciatica     Problem List Patient Active Problem List   Diagnosis Date Noted  . Failed back syndrome of lumbar spine 04/13/2018  . Urethral spasm   . Chronic back pain   . POTS (postural orthostatic tachycardia syndrome)   . Inappropriate sinus tachycardia   . Frequent UTI   . History of DVT (deep vein thrombosis) 05/17/2013    Sedalia Muta, PT, DPT 1:28 PM  05/24/18    [facility] [address] [place], [place], [address] Phone: [phone]   Fax:  [phone]  Name: Christie Hurst MRN: [id number] Date of Birth: 1976-05-18

## 2018-05-25 ENCOUNTER — Encounter: Payer: Self-pay | Admitting: Sports Medicine

## 2018-05-27 ENCOUNTER — Ambulatory Visit: Payer: 59 | Admitting: Sports Medicine

## 2018-05-30 ENCOUNTER — Encounter: Payer: Self-pay | Admitting: Physical Therapy

## 2018-05-30 ENCOUNTER — Ambulatory Visit: Payer: 59 | Admitting: Physical Therapy

## 2018-05-30 ENCOUNTER — Encounter: Payer: Self-pay | Admitting: Adult Health

## 2018-05-30 DIAGNOSIS — M5442 Lumbago with sciatica, left side: Secondary | ICD-10-CM | POA: Diagnosis not present

## 2018-05-30 DIAGNOSIS — G8929 Other chronic pain: Secondary | ICD-10-CM | POA: Diagnosis not present

## 2018-05-31 ENCOUNTER — Other Ambulatory Visit: Payer: Self-pay | Admitting: Adult Health

## 2018-05-31 DIAGNOSIS — R000 Tachycardia, unspecified: Secondary | ICD-10-CM

## 2018-05-31 DIAGNOSIS — N35919 Unspecified urethral stricture, male, unspecified site: Secondary | ICD-10-CM

## 2018-05-31 MED ORDER — AMITRIPTYLINE HCL 25 MG PO TABS: 25.0000 mg | ORAL_TABLET | Freq: Every day | ORAL | 1 refills | Status: DC

## 2018-05-31 MED ORDER — ACEBUTOLOL HCL 200 MG PO CAPS: 200.0000 mg | ORAL_CAPSULE | Freq: Every day | ORAL | 1 refills | Status: DC

## 2018-05-31 NOTE — Therapy (Signed)
[facility] [address] [place], [place], [address] Phone: [phone]   Fax:  [phone]  Physical Therapy Treatment  Patient Details  Name: Christie Hurst MRN: [id number] Date of Birth: 05/26/1976 Referring Provider (PT): Gaspar Bidding   Encounter Date: 05/30/2018  PT End of Session - 05/30/18 1111    Visit Number  3    Number of Visits  12    Date for PT Re-Evaluation  06/27/18    Authorization Type  UHC    PT Start Time  1100    PT Stop Time  1150    PT Time Calculation (min)  50 min    Activity Tolerance  Patient tolerated treatment well;Patient limited by pain    Behavior During Therapy  [facility] for tasks assessed/performed       Past Medical History:  Diagnosis Date  . Chronic back pain   . Frequent UTI    d/t congential abnormality   . History of DVT (deep vein thrombosis) 05/2013  . Inappropriate sinus tachycardia   . Phlebitis   . POTS (postural orthostatic tachycardia syndrome)   . Urethral spasm   . UTI (urinary tract infection)     Past Surgical History:  Procedure Laterality Date  . CESAREAN SECTION     x 2   . LAMINECTOMY  12/2012   L5/S1  . microdiscetomy  11/2012   L5/S1  . SPINAL FUSION  03/2013   L5/S1  . stent/DVT  05/2013   Left groin    There were no vitals filed for this visit.  Subjective Assessment - 05/30/18 1110    Subjective  Pt states she has been able to stretch and do HEP. She did have increased soreness after last visit ,but not significant increase in pain. She continues to feel a lot of tightness in her lateral lumbar region.     Patient Stated Goals  Decreased pain    Currently in Pain?  Yes    Pain Score  4     Pain Location  Back    Pain Orientation  Right    Pain Descriptors / Indicators  Burning;Tightness;Shooting    Pain Type  Chronic pain    Pain Onset  More than a month ago    Pain Frequency  Constant    Pain Score  5    Pain Location  Leg    Pain Orientation   Left    Pain Descriptors / Indicators  Burning    Pain Type  Chronic pain    Pain Onset  More than a month ago    Pain Frequency  Intermittent                       [facility] Adult PT Treatment/Exercise - 05/30/18 1112      Exercises   Exercises  Lumbar      Lumbar Exercises: Stretches   Active Hamstring Stretch  3 reps;30 seconds    Single Knee to Chest Stretch  --    Pelvic Tilt  --    Other Lumbar Stretch Exercise  Standing QL stretch 30 sec x3 bil; childs pose 30 sec x3;     Other Lumbar Stretch Exercise  Kneeling Hip flexor stretch 30 sec x2 bil;       Lumbar Exercises: Aerobic   Stationary Bike  L1 x 8 min      Lumbar Exercises: Supine   Ab Set  --  Manual Therapy   Manual Therapy  Soft tissue mobilization;Passive ROM;Manual Traction;Joint mobilization    Joint Mobilization  --    Soft tissue mobilization  DTM to L umbar paraspinals, QL, and L glute    Passive ROM  L Hamstring stretch with ankle pump for nerve glide    Manual Traction  light long leg distraction on L , 10 sec x5;        Trigger Point Dry Needling - 05/31/18 1233    Consent Given?  Yes    Education Handout Provided  Yes    Muscles Treated Lower Body  Gluteus minimus;Piriformis    Gluteus Minimus Response  Palpable increased muscle length   left   Piriformis Response  Twitch response elicited;Palpable increased muscle length   left            PT Short Term Goals - 05/17/18 1358      PT SHORT TERM GOAL #1   Title  Pt to report decreased pain in L LE and lumbar region, to 5/10 with activity     Time  3    Period  Weeks    Status  New    Target Date  06/06/18      PT SHORT TERM GOAL #2   Title  Pt to be independent with initial HEP     Time  2    Period  Weeks    Status  New    Target Date  05/30/18        PT Long Term Goals - 05/17/18 1359      PT LONG TERM GOAL #1   Title  Pt to be independent with final HEp for lumbar spine     Time  6    Period  Weeks     Status  New    Target Date  06/27/18      PT LONG TERM GOAL #2   Title  Pt to demo improved ROM for lumar spine, to be WNL for pt age    Time  6    Period  Weeks    Status  New    Target Date  06/27/18      PT LONG TERM GOAL #3   Title  Pt to report decreased pain in L LE and lumbar region, to 0-3/10 with activity, to improve ablility for IADLs and ADLS.     Time  6    Period  Weeks    Status  New    Target Date  06/27/18      PT LONG TERM GOAL #4   Title  Pt to report increased abiity for standing/walking, for up to 1 mile, with pain <3/10, to improve ability for community navigation.     Time  6    Period  Weeks    Status  New    Target Date  06/27/18            Plan - 05/30/18 1101    Clinical Impression Statement  Pt with significant soreness and tightness in QL region today. She has noted trigger points and tightness in L Glute, dry needling done for this today, pt with increased soreness after session, due to needling, will continue to assess next visit. Pt with improving ability for ther ex and stretching, not having signifciant pain with stretching. Will benefit from continued mobility work to improve ROM and tightness.     Rehab Potential  Good    PT Frequency  2x /  week    PT Duration  6 weeks    PT Treatment/Interventions  ADLs/Self Care Home Management;Cryotherapy;Electrical Stimulation;Iontophoresis 4mg /ml Dexamethasone;Functional mobility training;Stair training;Gait training;Ultrasound;Moist Heat;Therapeutic activities;Therapeutic exercise;Balance training;Neuromuscular re-education;Patient/family education;Passive range of motion;Manual techniques;Dry needling;Taping    PT Next Visit Plan  Teach HEP     Consulted and Agree with Plan of Care  Patient       Patient will benefit from skilled therapeutic intervention in order to improve the following deficits and impairments:  Abnormal gait, Decreased range of motion, Increased muscle spasms, Decreased activity  tolerance, Pain, Improper body mechanics, Impaired flexibility, Hypomobility, Decreased mobility, Decreased strength, Difficulty walking  Visit Diagnosis: Chronic bilateral low back pain with left-sided sciatica     Problem List Patient Active Problem List   Diagnosis Date Noted  . Failed back syndrome of lumbar spine 04/13/2018  . Urethral spasm   . Chronic back pain   . POTS (postural orthostatic tachycardia syndrome)   . Inappropriate sinus tachycardia   . Frequent UTI   . History of DVT (deep vein thrombosis) 05/17/2013    Sedalia Muta, PT, DPT 12:35 PM  05/31/18    [facility] [address] [place], [place], [address] Phone: [phone]   Fax:  [phone]  Name: Christie Hurst MRN: [id number] Date of Birth: 11/30/1975

## 2018-06-02 ENCOUNTER — Encounter: Payer: 59 | Admitting: Physical Therapy

## 2018-06-06 ENCOUNTER — Ambulatory Visit: Payer: 59 | Admitting: Physical Therapy

## 2018-06-06 ENCOUNTER — Encounter: Payer: Self-pay | Admitting: Physical Therapy

## 2018-06-06 DIAGNOSIS — M5442 Lumbago with sciatica, left side: Secondary | ICD-10-CM

## 2018-06-06 DIAGNOSIS — G8929 Other chronic pain: Secondary | ICD-10-CM | POA: Diagnosis not present

## 2018-06-06 NOTE — Therapy (Signed)
[facility] [address] [place], [place], [address] Phone: [phone]   Fax:  [phone]  Physical Therapy Treatment  Patient Details  Name: Christie Hurst MRN: [id number] Date of Birth: Jun 20, 1976 Referring Provider (PT): Gaspar Bidding   Encounter Date: 06/06/2018  PT End of Session - 06/06/18 1509    Visit Number  4    Number of Visits  12    Date for PT Re-Evaluation  06/27/18    Authorization Type  UHC    PT Start Time  1110    PT Stop Time  1155    PT Time Calculation (min)  45 min    Activity Tolerance  Patient tolerated treatment well;Patient limited by pain    Behavior During Therapy  [facility] for tasks assessed/performed       Past Medical History:  Diagnosis Date  . Chronic back pain   . Frequent UTI    d/t congential abnormality   . History of DVT (deep vein thrombosis) 05/2013  . Inappropriate sinus tachycardia   . Phlebitis   . POTS (postural orthostatic tachycardia syndrome)   . Urethral spasm   . UTI (urinary tract infection)     Past Surgical History:  Procedure Laterality Date  . CESAREAN SECTION     x 2   . LAMINECTOMY  12/2012   L5/S1  . microdiscetomy  11/2012   L5/S1  . SPINAL FUSION  03/2013   L5/S1  . stent/DVT  05/2013   Left groin    There were no vitals filed for this visit.  Subjective Assessment - 06/06/18 1508    Subjective  Pt states significant improvment in tightness in L glute and pain in L LE since last visit. She was able to sleep on that side for first time in a very long time. She does have soreness today mostly at R SI region.     Currently in Pain?  Yes    Pain Score  4     Pain Location  Back    Pain Orientation  Right    Pain Descriptors / Indicators  Aching;Shooting    Pain Type  Chronic pain    Pain Onset  More than a month ago    Pain Frequency  Intermittent    Multiple Pain Sites  Yes    Pain Score  3    Pain Location  Leg    Pain Orientation  Left    Pain  Descriptors / Indicators  Burning;Tightness    Pain Type  Chronic pain    Pain Onset  More than a month ago    Pain Frequency  Intermittent                       [facility] Adult PT Treatment/Exercise - 06/06/18 1117      Exercises   Exercises  Lumbar      Lumbar Exercises: Stretches   Active Hamstring Stretch  3 reps;30 seconds    Piriformis Stretch  3 reps;30 seconds    Piriformis Stretch Limitations  seated and Supine fig 4    Other Lumbar Stretch Exercise  Standing QL stretch 30 sec x3 bil;     Other Lumbar Stretch Exercise  Kneeling Hip flexor stretch 30 sec x2 bil;       Lumbar Exercises: Aerobic   Stationary Bike  L1 x 6 min      Lumbar Exercises: Supine   Clam  20 reps  Clam Limitations  RTB with TA    Dead Bug  20 reps      Manual Therapy   Manual Therapy  Soft tissue mobilization;Passive ROM;Manual Traction;Joint mobilization    Manual therapy comments  skilled palpation and monitoring of soft tissue during dry needling.     Joint Mobilization  R post and inf hip mobs on R;      Soft tissue mobilization  --    Passive ROM  L Hamstring stretch with ankle pump for nerve glide    Manual Traction  light long leg distraction on L , 10 sec x5;        Trigger Point Dry Needling - 06/06/18 1506    Consent Given?  Yes    Muscles Treated Lower Body  Gluteus minimus;Gluteus maximus;Piriformis   Deep hip rotatators: bilaterally   Gluteus Maximus Response  Palpable increased muscle length   bilateral   Gluteus Minimus Response  Palpable increased muscle length   bilateral   Piriformis Response  Palpable increased muscle length   bilateral            PT Short Term Goals - 05/17/18 1358      PT SHORT TERM GOAL #1   Title  Pt to report decreased pain in L LE and lumbar region, to 5/10 with activity     Time  3    Period  Weeks    Status  New    Target Date  06/06/18      PT SHORT TERM GOAL #2   Title  Pt to be independent with initial HEP      Time  2    Period  Weeks    Status  New    Target Date  05/30/18        PT Long Term Goals - 05/17/18 1359      PT LONG TERM GOAL #1   Title  Pt to be independent with final HEp for lumbar spine     Time  6    Period  Weeks    Status  New    Target Date  06/27/18      PT LONG TERM GOAL #2   Title  Pt to demo improved ROM for lumar spine, to be WNL for pt age    Time  6    Period  Weeks    Status  New    Target Date  06/27/18      PT LONG TERM GOAL #3   Title  Pt to report decreased pain in L LE and lumbar region, to 0-3/10 with activity, to improve ablility for IADLs and ADLS.     Time  6    Period  Weeks    Status  New    Target Date  06/27/18      PT LONG TERM GOAL #4   Title  Pt to report increased abiity for standing/walking, for up to 1 mile, with pain <3/10, to improve ability for community navigation.     Time  6    Period  Weeks    Status  New    Target Date  06/27/18            Plan - 06/06/18 1511    Clinical Impression Statement  Pt with very good response from first dry needling session. Dry needling done again today, she continues to have several areas of tightness and active trigger points. Pt will benefit from additional areas in low lumbar  region for pain relief. Started light core stabs today, will progress as able.     Rehab Potential  Good    PT Frequency  2x / week    PT Duration  6 weeks    PT Treatment/Interventions  ADLs/Self Care Home Management;Cryotherapy;Electrical Stimulation;Iontophoresis 4mg /ml Dexamethasone;Functional mobility training;Stair training;Gait training;Ultrasound;Moist Heat;Therapeutic activities;Therapeutic exercise;Balance training;Neuromuscular re-education;Patient/family education;Passive range of motion;Manual techniques;Dry needling;Taping    PT Next Visit Plan  Teach HEP     Consulted and Agree with Plan of Care  Patient       Patient will benefit from skilled therapeutic intervention in order to improve the  following deficits and impairments:  Abnormal gait, Decreased range of motion, Increased muscle spasms, Decreased activity tolerance, Pain, Improper body mechanics, Impaired flexibility, Hypomobility, Decreased mobility, Decreased strength, Difficulty walking  Visit Diagnosis: Chronic bilateral low back pain with left-sided sciatica     Problem List Patient Active Problem List   Diagnosis Date Noted  . Failed back syndrome of lumbar spine 04/13/2018  . Urethral spasm   . Chronic back pain   . POTS (postural orthostatic tachycardia syndrome)   . Inappropriate sinus tachycardia   . Frequent UTI   . History of DVT (deep vein thrombosis) 05/17/2013    Sedalia Muta, PT, DPT 3:12 PM  06/06/18    [facility] [address] [place], [place], [address] Phone: [phone]   Fax:  [phone]  Name: MAUREEN DELATTE MRN: [id number] Date of Birth: 07/13/1976

## 2018-06-09 ENCOUNTER — Encounter: Payer: Self-pay | Admitting: Psychiatry

## 2018-06-09 ENCOUNTER — Ambulatory Visit (INDEPENDENT_AMBULATORY_CARE_PROVIDER_SITE_OTHER): Payer: 59 | Admitting: Psychiatry

## 2018-06-09 VITALS — BP 123/75 | HR 89 | Temp 98.7°F | Wt 125.4 lb

## 2018-06-09 DIAGNOSIS — F419 Anxiety disorder, unspecified: Secondary | ICD-10-CM | POA: Insufficient documentation

## 2018-06-09 DIAGNOSIS — N189 Chronic kidney disease, unspecified: Secondary | ICD-10-CM | POA: Insufficient documentation

## 2018-06-09 DIAGNOSIS — M5410 Radiculopathy, site unspecified: Secondary | ICD-10-CM | POA: Insufficient documentation

## 2018-06-09 DIAGNOSIS — M5136 Other intervertebral disc degeneration, lumbar region: Secondary | ICD-10-CM | POA: Insufficient documentation

## 2018-06-09 DIAGNOSIS — G4701 Insomnia due to medical condition: Secondary | ICD-10-CM

## 2018-06-09 DIAGNOSIS — G8929 Other chronic pain: Secondary | ICD-10-CM | POA: Insufficient documentation

## 2018-06-09 DIAGNOSIS — R6510 Systemic inflammatory response syndrome (SIRS) of non-infectious origin without acute organ dysfunction: Secondary | ICD-10-CM

## 2018-06-09 DIAGNOSIS — I871 Compression of vein: Secondary | ICD-10-CM | POA: Insufficient documentation

## 2018-06-09 DIAGNOSIS — R000 Tachycardia, unspecified: Secondary | ICD-10-CM | POA: Insufficient documentation

## 2018-06-09 DIAGNOSIS — M4800 Spinal stenosis, site unspecified: Secondary | ICD-10-CM | POA: Insufficient documentation

## 2018-06-09 MED ORDER — AMITRIPTYLINE HCL 50 MG PO TABS: 50.0000 mg | ORAL_TABLET | Freq: Every day | ORAL | 0 refills | Status: DC

## 2018-06-09 NOTE — Progress Notes (Signed)
Psychiatric Initial Adult Assessment   Patient Identification: Christie Hurst MRN:  [id number] Date of Evaluation:  06/09/2018 Referral Source: Shirline Frees MD Chief Complaint:  ' I am here for follow up.' Chief Complaint    Establish Care     Visit Diagnosis:    ICD-10-CM   1. Insomnia due to medical condition G47.01     History of Present Illness:  Christie Hurst is a 42 yr old CF, who is married, on disability, lives in [place], recently relocated from [place], presented to the clinic today to establish care.  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  Patient today reports she was referred to the clinic since she was on medications like Klonopin and temazepam and her primary  provider wanted her to be seen by a psychiatrist.  She reports she takes temazepam for sleep and has been on it since the past few years .  Patient also reports being on Klonopin since 2012.  She reports she currently takes Klonopin 0.5 mg as needed at bedtime once a week or so.  She does not take it more than that.  She reports she takes it for sleep.  She reports she has severe back problems.  She reports she had microdiscectomy done in 2014.  A week later it ruptured and she had to undergo laminectomy.  3 months after that she underwent a fusion.  She reports 2 weeks later she had DVTs and pulmonary embolism which complicated her surgery.  Patient reports she had to go through significant life changes during that time.  Patient reports she was in pain all the time and hence had difficulty working anymore.  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  Patient reports she could not drive anymore and became dependent on others.  She had to apply for disability.  Patient at that time was seen by a psychotherapist and she went into counseling.  She struggled  with anxiety during that time and tried several SSRI medications.  Patient reports the SSRI medications like Celexa, Zoloft, Prozac and so on gave her several side effects and made her more anxious.  She has also tried Cymbalta which gave her adverse effects.  Patient reports being tried on medications like trazodone, hydroxyzine, melatonin for sleep and it always gave her side effects.  Patient reports even though she takes the temazepam she continues to struggle with sleep because of her pain.  She reports she has muscle spasms at night which makes it difficult for her to rest.    Patient currently takes amitriptyline which she reports is for her urinary problems , spasms related to her ureter anomaly.  Patient reports that the amitriptyline used to help her with her sleep in the past.  She however does not know if it is helpful anymore .  She however reports she cannot stop the amitriptyline since she gets spasms when she stops it.  Patient denies any depressive symptoms.  Patient denies any perceptual disturbances.  Patient denies any manic or hypomanic symptoms.  Patient denies any history of trauma.  Patient reports she was recently started on Belbuca for pain .  Discussed with patient that she needs to follow-up with her pain provider for management of her problems at night since her pain is contributing to her restlessness and insomnia.  However discussed with her that her amitriptyline which is also a sleep medication can be increased to see if that will help her.  Discussed referral for psychotherapy however patient reports she has tried therapy in the past and it tremendously helped her.  She does not have any significant mood symptoms or stressors at this time and does not think she needs to go back again.  Associated Signs/Symptoms: Depression Symptoms:  denies (Hypo) Manic Symptoms:  denies Anxiety Symptoms:  denies Psychotic Symptoms:  denies PTSD Symptoms: Negative  Past  Psychiatric History: Patient denies any inpatient mental health admissions.  Patient denies any suicide attempts.  Patient reports she was seen by a psychiatrist in the past for disability evaluation.  Previous Psychotropic Medications: Yes Celexa-jaw stiffness, Zoloft-restlessness, Prozac-restlessness, hydroxyzine -hyperactive, melatonin- sleep problems, Tylenol PM-restlessness, trazodone- adverse effects, temazepam, klonopin  Substance Abuse History in the last 12 months:  No.  Consequences of Substance Abuse: Negative  Past Medical History:  Past Medical History:  Diagnosis Date  . Chronic back pain   . Frequent UTI    d/t congential abnormality   . History of DVT (deep vein thrombosis) 05/2013  . Inappropriate sinus tachycardia   . Phlebitis   . POTS (postural orthostatic tachycardia syndrome)   . Tachycardia   . Urethral spasm   . UTI (urinary tract infection)     Past Surgical History:  Procedure Laterality Date  . CESAREAN SECTION     x 2   . LAMINECTOMY  12/2012   L5/S1  . microdiscetomy  11/2012   L5/S1  . SPINAL FUSION  03/2013   L5/S1  . stent/DVT  05/2013   Left groin    Family Psychiatric History: Mother-depression  Family History:  Family History  Problem Relation Age of Onset  . Early death Mother 24  . Transient ischemic attack Mother   . Depression Mother   . Mental illness Mother   . Transient ischemic attack Father   . Breast cancer Paternal Grandmother   . Breast cancer Paternal Aunt   . Breast cancer Paternal Aunt     Social History:   Social History   Socioeconomic History  . Marital status: Married    Spouse name: jamie  . Number of children: 2  . Years of education: Not on file  . Highest education level: Bachelor's degree (e.g., BA, AB, BS)  Occupational History  . Not on file  Social Needs  . Financial resource strain: Not hard at all  . Food insecurity:    Worry: Never true    Inability: Never true  . Transportation needs:     Medical: No    Non-medical: No  Tobacco Use  . Smoking status: Never Smoker  . Smokeless tobacco: Never Used  Substance and Sexual Activity  . Alcohol use: Never    Frequency: Never  . Drug use: Never  . Sexual activity: Yes  Lifestyle  . Physical activity:    Days per week: Not on file    Minutes per session: Not on file  . Stress: Very much  Relationships  . Social connections:    Talks on phone: Not on file    Gets together: Not on file    Attends religious service: Never    Active member of club or organization: No    Attends meetings of clubs or organizations: Never    Relationship status: Married  Other Topics Concern  . Not on file  Social History Narrative   Nurse -  on disability    Married 17 years    Two children ( 15 & 60)     Additional Social History: Patient is married.  She recently relocated to [place] from [place].  She currently lives there with her husband and 2 children aged 43 and 73.  She is on disability.  Her husband is employed.  Used to work as a Engineer, civil (consulting) in the past.  Allergies:   Allergies  Allergen Reactions  . Tamsulosin Hypertension, Photosensitivity, Rash and Shortness Of Breath  . Tramadol Anxiety, Palpitations and Shortness Of Breath  . Cephalexin Hives  . Duloxetine Hcl Other (See Comments)  . Lamotrigine Photosensitivity  . Sulfamethoxazole-Trimethoprim Hives and Palpitations    Metabolic Disorder Labs: No results found for: HGBA1C, MPG No results found for: PROLACTIN No results found for: CHOL, TRIG, HDL, CHOLHDL, VLDL, LDLCALC   Current Medications: Current Outpatient Medications  Medication Sig Dispense Refill  . acebutolol (SECTRAL) 200 MG capsule Take 1 capsule (200 mg total) by mouth daily. 90 capsule 1  . Alcaftadine 0.25 % SOLN Apply 1 drop to eye daily. 3 mL 3  . Buprenorphine HCl (BELBUCA) 150 MCG FILM Place 150 mcg inside cheek 2 (two) times daily.    Marland Kitchen HYDROmorphone (DILAUDID) 2 MG tablet Take 1 tablet (2 mg  total) by mouth every 6 (six) hours as needed for severe pain. 20 tablet 0  . nitrofurantoin (MACRODANTIN) 100 MG capsule Take 1 capsule (100 mg total) by mouth daily. 90 capsule 3  . olopatadine (PATANOL) 0.1 % ophthalmic solution     . tiZANidine (ZANAFLEX) 4 MG capsule Take 1 capsule (4 mg total) by mouth at bedtime. 90 capsule 1  . amitriptyline (ELAVIL) 50 MG tablet Take 1 tablet (50 mg total) by mouth at bedtime. Patient has supplies 30 tablet 0   No current facility-administered medications for this visit.     Neurologic: Headache: No Seizure: No Paresthesias:No  Musculoskeletal: Strength & Muscle Tone: within normal limits Gait & Station: normal Patient leans: N/A  Psychiatric Specialty Exam: Review of Systems  Musculoskeletal: Positive for back pain.  Psychiatric/Behavioral: The patient has insomnia.   All other systems reviewed and are negative.   Blood pressure 123/75, pulse 89, temperature 98.7 F (37.1 C), temperature source Oral, weight 125 lb 6.4 oz (56.9 kg).Body mass index is 21.52 kg/m.  General Appearance: Casual  Eye Contact:  Fair  Speech:  Clear and Coherent  Volume:  Normal  Mood:  Euthymic  Affect:  Congruent  Thought Process:  Goal Directed and Descriptions of Associations: Intact  Orientation:  Full (Time, Place, and Person)  Thought Content:  Logical  Suicidal Thoughts:  No  Homicidal Thoughts:  No  Memory:  Immediate;   Fair Recent;   Fair Remote;   Fair  Judgement:  Fair  Insight:  Fair  Psychomotor Activity:  Normal  Concentration:  Concentration: Fair and Attention Span: Fair  Recall:  [employer] of Knowledge:Fair  Language: Fair  Akathisia:  No  Handed:  Right  AIMS (if indicated): na  Assets:  Communication Skills Desire for Improvement Social Support  ADL's:  Intact  Cognition: WNL  Sleep:  restless    Treatment Plan Summary:Anira is a 42 yr old caucasian female, on disability, married, lives in [place], recently  relocated from [place], has a history of chronic pain syndrome, multiple back surgeries with complications, insomnia, congenital anomaly of the ureter, inappropriate tachycardia, presented to the clinic today to establish care.  Patient  reports she struggles with insomnia due to her pain problems.  Patient reports she is on benzodiazepine medications and was referred here by her primary medical doctor for an evaluation.  Discussed with patient the interaction between buprenorphine which was recently prescribed to her by her pain provider as well as benzodiazepine therapy.  Also discussed with her the adverse effects and risk of being on long-term benzodiazepine treatment.  Patient currently denies any significant anxiety or depressive symptoms.  She reports her insomnia is mostly due to her muscle spasms and pain.  Discussed psychotherapy referral as well as medication management as noted below.  Plan as noted below. Medication management and Plan as noted below  Plan For insomnia As noted above is due to her pain as well as muscle spasms. Discussed with her the risk of combining benzodiazepines with buprenorphine and other opiate medications. Discussed discontinuing temazepam and she agrees with plan.  She was also combining Klonopin with temazepam at least some nights.  Provided her education about combining to benzodiazepine medications together.  She voiced understanding.  She was not taking Klonopin regularly and hence discussed with her to stop taking it. Increase amitriptyline to 50 mg p.o. nightly.  Patient is on amitriptyline for spasms related to her anomaly of ureter.  She reports it also helps her to sleep at least when she initially started it. Also discussed sleep hygiene techniques. PHQ 9 - 2 GAD 7 - 1  Discussed with patient to return to clinic in 1 week.  Also discussed with her to reach out to her pain provider for management of her pain as well as muscle spasms which is also affecting  her sleep.  More than 50 % of the time was spent for psychoeducation and supportive psychotherapy and care coordination.  This note was generated in part or whole with voice recognition software. Voice recognition is usually quite accurate but there are transcription errors that can and very often do occur. I apologize for any typographical errors that were not detected and corrected.     Jomarie Longs, MD 10/24/20195:34 PM

## 2018-06-09 NOTE — Patient Instructions (Signed)
Amitriptyline tablets What is this medicine? AMITRIPTYLINE (a mee TRIP ti leen) is used to treat depression. This medicine may be used for other purposes; ask your health care provider or pharmacist if you have questions. COMMON BRAND NAME(S): Elavil, Vanatrip What should I tell my health care provider before I take this medicine? They need to know if you have any of these conditions: -an alcohol problem -asthma, difficulty breathing -bipolar disorder or schizophrenia -difficulty passing urine, prostate trouble -glaucoma -heart disease or previous heart attack -liver disease -over active thyroid -seizures -thoughts or plans of suicide, a previous suicide attempt, or family history of suicide attempt -an unusual or allergic reaction to amitriptyline, other medicines, foods, dyes, or preservatives -pregnant or trying to get pregnant -breast-feeding How should I use this medicine? Take this medicine by mouth with a drink of water. Follow the directions on the prescription label. You can take the tablets with or without food. Take your medicine at regular intervals. Do not take it more often than directed. Do not stop taking this medicine suddenly except upon the advice of your doctor. Stopping this medicine too quickly may cause serious side effects or your condition may worsen. A special MedGuide will be given to you by the pharmacist with each prescription and refill. Be sure to read this information carefully each time. Talk to your pediatrician regarding the use of this medicine in children. Special care may be needed. Overdosage: If you think you have taken too much of this medicine contact a poison control center or emergency room at once. NOTE: This medicine is only for you. Do not share this medicine with others. What if I miss a dose? If you miss a dose, take it as soon as you can. If it is almost time for your next dose, take only that dose. Do not take double or extra doses. What  may interact with this medicine? Do not take this medicine with any of the following medications: -arsenic trioxide -certain medicines used to regulate abnormal heartbeat or to treat other heart conditions -cisapride -droperidol -halofantrine -linezolid -MAOIs like Carbex, Eldepryl, Marplan, Nardil, and Parnate -methylene blue -other medicines for mental depression -phenothiazines like perphenazine, thioridazine and chlorpromazine -pimozide -probucol -procarbazine -sparfloxacin -St. John's Wort -ziprasidone This medicine may also interact with the following medications: -atropine and related drugs like hyoscyamine, scopolamine, tolterodine and others -barbiturate medicines for inducing sleep or treating seizures, like phenobarbital -cimetidine -disulfiram -ethchlorvynol -thyroid hormones such as levothyroxine This list may not describe all possible interactions. Give your health care provider a list of all the medicines, herbs, non-prescription drugs, or dietary supplements you use. Also tell them if you smoke, drink alcohol, or use illegal drugs. Some items may interact with your medicine. What should I watch for while using this medicine? Tell your doctor if your symptoms do not get better or if they get worse. Visit your doctor or health care professional for regular checks on your progress. Because it may take several weeks to see the full effects of this medicine, it is important to continue your treatment as prescribed by your doctor. Patients and their families should watch out for new or worsening thoughts of suicide or depression. Also watch out for sudden changes in feelings such as feeling anxious, agitated, panicky, irritable, hostile, aggressive, impulsive, severely restless, overly excited and hyperactive, or not being able to sleep. If this happens, especially at the beginning of treatment or after a change in dose, call your health care professional. You may get   drowsy or  dizzy. Do not drive, use machinery, or do anything that needs mental alertness until you know how this medicine affects you. Do not stand or sit up quickly, especially if you are an older patient. This reduces the risk of dizzy or fainting spells. Alcohol may interfere with the effect of this medicine. Avoid alcoholic drinks. Do not treat yourself for coughs, colds, or allergies without asking your doctor or health care professional for advice. Some ingredients can increase possible side effects. Your mouth may get dry. Chewing sugarless gum or sucking hard candy, and drinking plenty of water will help. Contact your doctor if the problem does not go away or is severe. This medicine may cause dry eyes and blurred vision. If you wear contact lenses you may feel some discomfort. Lubricating drops may help. See your eye doctor if the problem does not go away or is severe. This medicine can cause constipation. Try to have a bowel movement at least every 2 to 3 days. If you do not have a bowel movement for 3 days, call your doctor or health care professional. This medicine can make you more sensitive to the sun. Keep out of the sun. If you cannot avoid being in the sun, wear protective clothing and use sunscreen. Do not use sun lamps or tanning beds/booths. What side effects may I notice from receiving this medicine? Side effects that you should report to your doctor or health care professional as soon as possible: -allergic reactions like skin rash, itching or hives, swelling of the face, lips, or tongue -anxious -breathing problems -changes in vision -confusion -elevated mood, decreased need for sleep, racing thoughts, impulsive behavior -eye pain -fast, irregular heartbeat -feeling faint or lightheaded, falls -feeling agitated, angry, or irritable -fever with increased sweating -hallucination, loss of contact with reality -seizures -stiff muscles -suicidal thoughts or other mood  changes -tingling, pain, or numbness in the feet or hands -trouble passing urine or change in the amount of urine -trouble sleeping -unusually weak or tired -vomiting -yellowing of the eyes or skin Side effects that usually do not require medical attention (report to your doctor or health care professional if they continue or are bothersome): -change in sex drive or performance -change in appetite or weight -constipation -dizziness -dry mouth -nausea -tired -tremors -upset stomach This list may not describe all possible side effects. Call your doctor for medical advice about side effects. You may report side effects to FDA at 1-800-FDA-1088. Where should I keep my medicine? Keep out of the reach of children. Store at room temperature between 20 and 25 degrees C (68 and 77 degrees F). Throw away any unused medicine after the expiration date. NOTE: This sheet is a summary. It may not cover all possible information. If you have questions about this medicine, talk to your doctor, pharmacist, or health care provider.  2018 Elsevier/Gold Standard (2016-01-03 12:14:15)  

## 2018-06-10 ENCOUNTER — Telehealth: Payer: Self-pay

## 2018-06-10 NOTE — Telephone Encounter (Signed)
pt called left message that she seen you yesterday and you changed her medication.  pt states that she did not get any sleep at all last night.

## 2018-06-10 NOTE — Telephone Encounter (Signed)
Please ask her to use restoril half to one tablet at bedtime along with her current elavil and come back and see me sooner next week. Also pls ask her to reach out to pain provider since her sleep issues are due to pain and spasms also

## 2018-06-13 ENCOUNTER — Telehealth: Payer: Self-pay | Admitting: Psychiatry

## 2018-06-13 ENCOUNTER — Encounter: Payer: Self-pay | Admitting: Adult Health

## 2018-06-13 MED ORDER — MIRTAZAPINE 15 MG PO TABS: 7.5000 mg | ORAL_TABLET | Freq: Every day | ORAL | 0 refills | Status: DC

## 2018-06-13 NOTE — Telephone Encounter (Signed)
Will send Remeron 7.5 mg to pharmacy for sleep.

## 2018-06-13 NOTE — Telephone Encounter (Signed)
Please call patient and let her know Remeron sent to pharmacy for sleep

## 2018-06-13 NOTE — Telephone Encounter (Signed)
pt called states that she is not sleeping can something else be called in.

## 2018-06-15 ENCOUNTER — Ambulatory Visit: Payer: 59 | Admitting: Physical Therapy

## 2018-06-15 DIAGNOSIS — M5442 Lumbago with sciatica, left side: Secondary | ICD-10-CM

## 2018-06-15 DIAGNOSIS — G8929 Other chronic pain: Secondary | ICD-10-CM | POA: Diagnosis not present

## 2018-06-16 ENCOUNTER — Encounter: Payer: Self-pay | Admitting: Physical Therapy

## 2018-06-16 DIAGNOSIS — Z79899 Other long term (current) drug therapy: Secondary | ICD-10-CM | POA: Diagnosis not present

## 2018-06-16 DIAGNOSIS — G894 Chronic pain syndrome: Secondary | ICD-10-CM | POA: Diagnosis not present

## 2018-06-16 DIAGNOSIS — M961 Postlaminectomy syndrome, not elsewhere classified: Secondary | ICD-10-CM | POA: Diagnosis not present

## 2018-06-16 NOTE — Therapy (Signed)
[facility] [address] [place], [place], [address] Phone: [phone]   Fax:  [phone]  Physical Therapy Treatment  Patient Details  Name: Christie Hurst MRN: [id number] Date of Birth: 05-24-1976 Referring Provider (PT): Teresa Coombs   Encounter Date: 06/15/2018  PT End of Session - 06/16/18 0827    Visit Number  5    Number of Visits  12    Date for PT Re-Evaluation  06/27/18    Authorization Type  [employer]    PT Start Time  1103    PT Stop Time  1150    PT Time Calculation (min)  47 min    Activity Tolerance  Patient tolerated treatment well;Patient limited by pain    Behavior During Therapy  [facility] for tasks assessed/performed       Past Medical History:  Diagnosis Date  . Chronic back pain   . Frequent UTI    d/t congential abnormality   . History of DVT (deep vein thrombosis) 05/2013  . Inappropriate sinus tachycardia   . Phlebitis   . POTS (postural orthostatic tachycardia syndrome)   . Tachycardia   . Urethral spasm   . UTI (urinary tract infection)     Past Surgical History:  Procedure Laterality Date  . CESAREAN SECTION     x 2   . LAMINECTOMY  12/2012   L5/S1  . microdiscetomy  11/2012   L5/S1  . SPINAL FUSION  03/2013   L5/S1  . stent/DVT  05/2013   Left groin    There were no vitals filed for this visit.  Subjective Assessment - 06/16/18 0824    Subjective  Pt states increased pain in bil, lateral hips in last few days. She also has pain in L glute. She states pain has increased in last few days, and she is also not sleeping well at all, due to medication change. She saw new psychyatrist that changed a few meds.     Patient Stated Goals  Decreased pain    Currently in Pain?  Yes    Pain Score  3     Pain Location  Back    Pain Orientation  Right;Left    Pain Descriptors / Indicators  Aching;Tightness    Pain Type  Chronic pain    Pain Onset  More than a month ago    Pain Frequency   Intermittent    Pain Score  5    Pain Location  Leg    Pain Orientation  Left    Pain Descriptors / Indicators  Burning;Tightness    Pain Type  Chronic pain    Pain Onset  Yesterday    Pain Frequency  Intermittent                       [facility] Adult PT Treatment/Exercise - 06/15/18 1101      Exercises   Exercises  Lumbar      Lumbar Exercises: Stretches   Active Hamstring Stretch  3 reps;30 seconds    Piriformis Stretch  3 reps;30 seconds    Piriformis Stretch Limitations  Supine fig 4    Other Lumbar Stretch Exercise  Standing QL stretch 30 sec x3 bil;     Other Lumbar Stretch Exercise  childs pose 30 sec x3;       Lumbar Exercises: Aerobic   Stationary Bike  L1 x 6 min      Lumbar Exercises: Supine   Clam  --  Clam Limitations  --    Dead Bug  20 reps      Manual Therapy   Manual Therapy  Soft tissue mobilization;Passive ROM;Manual Traction;Joint mobilization    Manual therapy comments  skilled palpation and monitoring of soft tissue during dry needling.     Joint Mobilization  L post and inf hip mobs;     Passive ROM  L Hamstring stretch with ankle pump for nerve glide    Manual Traction  light long leg distraction bil; , 10 sec x5;              PT Education - 06/16/18 0827    Education Details  Discussed need to continue HEP, discussed deep breathing and ways to decrease body tension when going to sleep.     Person(s) Educated  Patient    Methods  Explanation    Comprehension  Verbalized understanding       PT Short Term Goals - 06/16/18 0828      PT SHORT TERM GOAL #1   Title  Pt to report decreased pain in L LE and lumbar region, to 5/10 with activity     Time  3    Period  Weeks    Status  Partially Met      PT SHORT TERM GOAL #2   Title  Pt to be independent with initial HEP     Time  2    Period  Weeks    Status  Achieved        PT Long Term Goals - 05/17/18 1359      PT LONG TERM GOAL #1   Title  Pt to be independent  with final HEp for lumbar spine     Time  6    Period  Weeks    Status  New    Target Date  06/27/18      PT LONG TERM GOAL #2   Title  Pt to demo improved ROM for lumar spine, to be WNL for pt age    Time  6    Period  Weeks    Status  New    Target Date  06/27/18      PT LONG TERM GOAL #3   Title  Pt to report decreased pain in L LE and lumbar region, to 0-3/10 with activity, to improve ablility for IADLs and ADLS.     Time  6    Period  Weeks    Status  New    Target Date  06/27/18      PT LONG TERM GOAL #4   Title  Pt to report increased abiity for standing/walking, for up to 1 mile, with pain <3/10, to improve ability for community navigation.     Time  6    Period  Weeks    Status  New    Target Date  06/27/18            Plan - 06/16/18 0829    Clinical Impression Statement  Pt with no increased pain with ther ex today. She has noted improvements in hip ROM for flexion and rotation, as well as lumbar flexion ROM. Pt with improved ease of movement. Discussed other stress relievers for night time and falling asleep such as deep breathing. Pt frustrated with not sleeping, due to medication changes. Pt making progress with ROM and movement, as well as pain in L LE, but does continue to have bothersome pain.     Rehab  Potential  Good    PT Frequency  2x / week    PT Duration  6 weeks    PT Treatment/Interventions  ADLs/Self Care Home Management;Cryotherapy;Electrical Stimulation;Iontophoresis 65m/ml Dexamethasone;Functional mobility training;Stair training;Gait training;Ultrasound;Moist Heat;Therapeutic activities;Therapeutic exercise;Balance training;Neuromuscular re-education;Patient/family education;Passive range of motion;Manual techniques;Dry needling;Taping    PT Next Visit Plan  Teach HEP     Consulted and Agree with Plan of Care  Patient       Patient will benefit from skilled therapeutic intervention in order to improve the following deficits and impairments:   Abnormal gait, Decreased range of motion, Increased muscle spasms, Decreased activity tolerance, Pain, Improper body mechanics, Impaired flexibility, Hypomobility, Decreased mobility, Decreased strength, Difficulty walking  Visit Diagnosis: Chronic bilateral low back pain with left-sided sciatica     Problem List Patient Active Problem List   Diagnosis Date Noted  . Anxiety 06/09/2018  . Chronic kidney disease due to systemic infection ([facility] 06/09/2018  . Chronic pain 06/09/2018  . Degenerative disc disease, lumbar 06/09/2018  . May-Thurner syndrome 06/09/2018  . Radiculopathy 06/09/2018  . Spinal stenosis 06/09/2018  . Tachycardia 06/09/2018  . Failed back syndrome of lumbar spine 04/13/2018  . Urethral spasm   . Chronic back pain   . POTS (postural orthostatic tachycardia syndrome)   . Inappropriate sinus tachycardia   . Frequent UTI   . History of DVT (deep vein thrombosis) 05/17/2013   LLyndee Hensen PT, DPT 8:33 AM  06/16/18    [facility] [place] [address]Phone: [phone]  Fax:  [phone] Name: Christie BLOUCHMRN: [id number] of Birth: 1Sep 20, 1977

## 2018-06-21 ENCOUNTER — Other Ambulatory Visit: Payer: Self-pay | Admitting: Family Medicine

## 2018-06-21 ENCOUNTER — Encounter: Payer: 59 | Admitting: Physical Therapy

## 2018-06-21 MED ORDER — OLOPATADINE HCL 0.1 % OP SOLN: 1.0000 [drp] | Freq: Two times a day (BID) | OPHTHALMIC | 1 refills | Status: DC

## 2018-06-22 ENCOUNTER — Ambulatory Visit: Payer: 59 | Admitting: Psychiatry

## 2018-06-22 ENCOUNTER — Other Ambulatory Visit: Payer: Self-pay

## 2018-06-22 ENCOUNTER — Telehealth: Payer: Self-pay

## 2018-06-22 ENCOUNTER — Encounter: Payer: Self-pay | Admitting: Psychiatry

## 2018-06-22 VITALS — BP 123/80 | HR 101 | Temp 98.8°F | Wt 124.8 lb

## 2018-06-22 DIAGNOSIS — G4701 Insomnia due to medical condition: Secondary | ICD-10-CM | POA: Diagnosis not present

## 2018-06-22 MED ORDER — QUETIAPINE FUMARATE 25 MG PO TABS: 12.5000 mg | ORAL_TABLET | Freq: Every evening | ORAL | 0 refills | Status: DC | PRN

## 2018-06-22 NOTE — Telephone Encounter (Signed)
Thanks a lot. Patient wants something closer to home .

## 2018-06-22 NOTE — Telephone Encounter (Signed)
a referral was created for our [place] Location for Dr. Vanetta Shawl.

## 2018-06-22 NOTE — Patient Instructions (Signed)
Quetiapine tablets What is this medicine? QUETIAPINE (kwe TYE a peen) is an antipsychotic. It is used to treat schizophrenia and bipolar disorder, also known as manic-depression. This medicine may be used for other purposes; ask your health care provider or pharmacist if you have questions. COMMON BRAND NAME(S): Seroquel What should I tell my health care provider before I take this medicine? They need to know if you have any of these conditions: -brain tumor or head injury -breast cancer -cataracts -diabetes -difficulty swallowing -heart disease -kidney disease -liver disease -low blood counts, like low white cell, platelet, or red cell counts -low blood pressure or dizziness when standing up -Parkinson's disease -previous heart attack -seizures -suicidal thoughts, plans, or attempt by you or a family member -thyroid disease -an unusual or allergic reaction to quetiapine, other medicines, foods, dyes, or preservatives -pregnant or trying to get pregnant -breast-feeding How should I use this medicine? Take this medicine by mouth. Swallow it with a drink of water. Follow the directions on the prescription label. If it upsets your stomach you can take it with food. Take your medicine at regular intervals. Do not take it more often than directed. Do not stop taking except on the advice of your doctor or health care professional. A special MedGuide will be given to you by the pharmacist with each prescription and refill. Be sure to read this information carefully each time. Talk to your pediatrician regarding the use of this medicine in children. While this drug may be prescribed for children as young as 10 years for selected conditions, precautions do apply. Patients over age 65 years may have a stronger reaction to this medicine and need smaller doses. Overdosage: If you think you have taken too much of this medicine contact a poison control center or emergency room at once. NOTE: This  medicine is only for you. Do not share this medicine with others. What if I miss a dose? If you miss a dose, take it as soon as you can. If it is almost time for your next dose, take only that dose. Do not take double or extra doses. What may interact with this medicine? Do not take this medicine with any of the following medications: -certain medicines for fungal infections like fluconazole, itraconazole, ketoconazole, posaconazole, voriconazole -cisapride -dofetilide -dronedarone -droperidol -grepafloxacin -halofantrine -phenothiazines like chlorpromazine, mesoridazine, thioridazine -pimozide -sparfloxacin -ziprasidone This medicine may also interact with the following medications: -alcohol -antiviral medicines for HIV or AIDS -certain medicines for blood pressure -certain medicines for depression, anxiety, or psychotic disturbances like haloperidol, lorazepam -certain medicines for diabetes -certain medicines for Parkinson's disease -certain medicines for seizures like carbamazepine, phenobarbital, phenytoin -cimetidine -erythromycin -other medicines that prolong the QT interval (cause an abnormal heart rhythm) -rifampin -steroid medicines like prednisone or cortisone This list may not describe all possible interactions. Give your health care provider a list of all the medicines, herbs, non-prescription drugs, or dietary supplements you use. Also tell them if you smoke, drink alcohol, or use illegal drugs. Some items may interact with your medicine. What should I watch for while using this medicine? Visit your doctor or health care professional for regular checks on your progress. It may be several weeks before you see the full effects of this medicine. Your health care provider may suggest that you have your eyes examined prior to starting this medicine, and every 6 months thereafter. If you have been taking this medicine regularly for some time, do not suddenly stop taking it.  You must gradually   reduce the dose or your symptoms may get worse. Ask your doctor or health care professional for advice. Patients and their families should watch out for worsening depression or thoughts of suicide. Also watch out for sudden or severe changes in feelings such as feeling anxious, agitated, panicky, irritable, hostile, aggressive, impulsive, severely restless, overly excited and hyperactive, or not being able to sleep. If this happens, especially at the beginning of antidepressant treatment or after a change in dose, call your health care professional. You may get dizzy or drowsy. Do not drive, use machinery, or do anything that needs mental alertness until you know how this medicine affects you. Do not stand or sit up quickly, especially if you are an older patient. This reduces the risk of dizzy or fainting spells. Alcohol can increase dizziness and drowsiness. Avoid alcoholic drinks. Do not treat yourself for colds, diarrhea or allergies. Ask your doctor or health care professional for advice, some ingredients may increase possible side effects. This medicine can reduce the response of your body to heat or cold. Dress warm in cold weather and stay hydrated in hot weather. If possible, avoid extreme temperatures like saunas, hot tubs, very hot or cold showers, or activities that can cause dehydration such as vigorous exercise. What side effects may I notice from receiving this medicine? Side effects that you should report to your doctor or health care professional as soon as possible: -allergic reactions like skin rash, itching or hives, swelling of the face, lips, or tongue -difficulty swallowing -fast or irregular heartbeat -fever or chills, sore throat -fever with rash, swollen lymph nodes, or swelling of the face -increased hunger or thirst -increased urination -problems with balance, talking, walking -seizures -stiff muscles -suicidal thoughts or other mood  changes -uncontrollable head, mouth, neck, arm, or leg movements -unusually weak or tired Side effects that usually do not require medical attention (report to your doctor or health care professional if they continue or are bothersome): -change in sex drive or performance -constipation -drowsy or dizzy -dry mouth -stomach upset -weight gain This list may not describe all possible side effects. Call your doctor for medical advice about side effects. You may report side effects to FDA at 1-800-FDA-1088. Where should I keep my medicine? Keep out of the reach of children. Store at room temperature between 15 and 30 degrees C (59 and 86 degrees F). Throw away any unused medicine after the expiration date. NOTE: This sheet is a summary. It may not cover all possible information. If you have questions about this medicine, talk to your doctor, pharmacist, or health care provider.  2018 Elsevier/Gold Standard (2015-02-05 13:07:35)  

## 2018-06-22 NOTE — Telephone Encounter (Signed)
I will be happy to take over the care. Lupita Leash, could you schedule her in a month for 30 mins follow up? Thanks.

## 2018-06-22 NOTE — Telephone Encounter (Signed)
dr. Elna Breslow requested that a referral be sent to our [place] location. pt states it is closer for her to go there.

## 2018-06-22 NOTE — Telephone Encounter (Signed)
Thanks a lot 

## 2018-06-22 NOTE — Progress Notes (Signed)
[facility] MD OP Progress Note  06/22/2018 12:33 PM Christie Hurst  MRN:  [id number]  Chief Complaint: ' I am here for follow up.' Chief Complaint    Follow-up; Medication Refill     HPI: Christie Hurst is a 42 yr old caucasian female who is married, on disability, lives in [place], recently relocated from [place], presented to the clinic today for a follow-up visit.  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  Patient today reports she she was able to sleep well last night on the mirtazapine which was prescribed.  Patient reports she did not sleep well the first couple of nights but last night was better for her.  She reports she continued her amitriptyline which was increased from 25-50 mg .  She is on amitriptyline for her bladder spasms.  However since amitriptyline also helps with sleep she was asked to increase the dosage to 50 mg during her initial visit here.  When she did not sleep just on the amitriptyline, a prescription for mirtazapine was sent.  Patient also reports she reached out to her pain providers who changed her muscle relaxant.  They also advised her to take her Belbuca early since that can also affect her sleep.  Patient reports she went without sleep for 2 weeks and became almost delirious and confused.  She is worried about that happening again.  Discussed with patient to continue the current medication regimen since she had a better night last night.  However discussed with her she can be given an as needed dosage of Seroquel which she can use occasionally when she needs it.  She agrees with plan.  Patient today reports her mood as fair.  She denies any depression or anxiety symptoms.  Patient reports she would like her care to be transferred closer to [facility].  She would also want to know if she can be referred to [facility]  since that is also closer for her. Visit Diagnosis:    ICD-10-CM   1. Insomnia due to medical condition G47.01     Past Psychiatric History: Reviewed past psychiatric history from my progress note on 06/09/2018.  Past trials of Celexa-jaw stiffness, Zoloft-restlessness, Prozac-restlessness, hydroxyzine-hyperactive, melatonin-sleep problems, Tylenol PM-restlessness, trazodone,-adverse effects, temazepam, Klonopin,  Past Medical History:  Past Medical History:  Diagnosis Date  . Chronic back pain   . Frequent UTI    d/t congential abnormality   . History of DVT (deep vein thrombosis) 05/2013  . Inappropriate sinus tachycardia   . Phlebitis   . POTS (postural orthostatic tachycardia syndrome)   . Tachycardia   . Urethral spasm   . UTI (urinary tract infection)     Past Surgical History:  Procedure Laterality Date  . CESAREAN SECTION     x 2   . LAMINECTOMY  12/2012   L5/S1  . microdiscetomy  11/2012   L5/S1  . SPINAL FUSION  03/2013   L5/S1  . stent/DVT  05/2013   Left groin    Family Psychiatric History: I have reviewed family psychiatric history from my progress note on 06/09/2018  Family History:  Family History  Problem Relation Age of Onset  . Early death Mother 74  . Transient ischemic attack Mother   . Depression Mother   . Mental illness Mother   . Transient ischemic attack Father   . Breast cancer Paternal  Grandmother   . Breast cancer Paternal Aunt   . Breast cancer Paternal Aunt     Social History: I have reviewed social history from my progress note on 06/09/2018 Social History   Socioeconomic History  . Marital status: Married    Spouse name: jamie  . Number of children: 2  . Years of education: Not on file  . Highest education level: Bachelor's degree (e.g., BA, AB, BS)  Occupational History  . Not on file  Social Needs  . Financial resource strain: Not hard at all  . Food insecurity:    Worry: Never true    Inability: Never true  .  Transportation needs:    Medical: No    Non-medical: No  Tobacco Use  . Smoking status: Never Smoker  . Smokeless tobacco: Never Used  Substance and Sexual Activity  . Alcohol use: Never    Frequency: Never  . Drug use: Never  . Sexual activity: Yes  Lifestyle  . Physical activity:    Days per week: 0 days    Minutes per session: 0 min  . Stress: Very much  Relationships  . Social connections:    Talks on phone: Not on file    Gets together: Not on file    Attends religious service: Never    Active member of club or organization: No    Attends meetings of clubs or organizations: Never    Relationship status: Married  Other Topics Concern  . 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 Tamsulosin Hypertension, Photosensitivity, Rash and Shortness Of Breath  . Tramadol Anxiety, Palpitations and Shortness Of Breath  . Cephalexin Hives  . Duloxetine Hcl Other (See Comments)  . Lamotrigine Photosensitivity  . Sulfamethoxazole-Trimethoprim Hives and Palpitations    Metabolic Disorder Labs: No results found for: HGBA1C, MPG No results found for: PROLACTIN No results found for: CHOL, TRIG, HDL, CHOLHDL, VLDL, LDLCALC No results found for: TSH  Therapeutic Level Labs: No results found for: LITHIUM No results found for: VALPROATE No components found for:  CBMZ  Current Medications: Current Outpatient Medications  Medication Sig Dispense Refill  . acebutolol (SECTRAL) 200 MG capsule Take 1 capsule (200 mg total) by mouth daily. 90 capsule 1  . Alcaftadine 0.25 % SOLN Apply 1 drop to eye daily. 3 mL 3  . amitriptyline (ELAVIL) 50 MG tablet Take 1 tablet (50 mg total) by mouth at bedtime. Patient has supplies 30 tablet 0  . BACLOFEN PO Take 10 mg by mouth 3 (three) times daily.    . Buprenorphine HCl (BELBUCA) 150 MCG FILM Place 150 mcg inside cheek 2 (two)  times daily.    Marland Kitchen HYDROmorphone (DILAUDID) 2 MG tablet Take 1 tablet (2 mg total) by mouth every 6 (six) hours as needed for severe pain. 20 tablet 0  . mirtazapine (REMERON) 15 MG tablet Take 0.5 tablets (7.5 mg total) by mouth at bedtime. For sleep 30 tablet 0  . nitrofurantoin (MACRODANTIN) 100 MG capsule Take 1 capsule (100 mg total) by mouth daily. 90 capsule 3  . olopatadine (PATANOL) 0.1 % ophthalmic solution Place 1 drop into both eyes 2 (two) times daily. 5 mL 1  . QUEtiapine (SEROQUEL) 25 MG tablet Take 0.5-1 tablets (12.5-25 mg total) by mouth at bedtime as needed. For racing thoughts,sleep 30 tablet  0   No current facility-administered medications for this visit.      Musculoskeletal: Strength & Muscle Tone: within normal limits Gait & Station: normal Patient leans: N/A  Psychiatric Specialty Exam: Review of Systems  Psychiatric/Behavioral: The patient is nervous/anxious and has insomnia.   All other systems reviewed and are negative.   Blood pressure 123/80, pulse (!) 101, temperature 98.8 F (37.1 C), temperature source Oral, weight 124 lb 12.8 oz (56.6 kg).Body mass index is 21.42 kg/m.  General Appearance: Casual  Eye Contact:  Fair  Speech:  Clear and Coherent  Volume:  Normal  Mood:  Anxious  Affect:  Congruent  Thought Process:  Goal Directed and Descriptions of Associations: Intact  Orientation:  Full (Time, Place, and Person)  Thought Content: Logical   Suicidal Thoughts:  No  Homicidal Thoughts:  No  Memory:  Immediate;   Fair Recent;   Fair Remote;   Fair  Judgement:  Fair  Insight:  Fair  Psychomotor Activity:  Normal  Concentration:  Concentration: Fair and Attention Span: Fair  Recall:  [employer] of Knowledge: Fair  Language: Fair  Akathisia:  No  Handed:  Right  AIMS (if indicated): na  Assets:  Communication Skills Desire for Improvement Social Support  ADL's:  Intact  Cognition: WNL  Sleep:  Poor improving , had a good night sleep  last night   Screenings:   Assessment and Plan: Christie Hurst is a 42 year old Caucasian female on disability, married, lives in [place], recently relocated from [place], has a history of chronic pain syndrome, multiple back surgeries with complications, insomnia, congenital anomaly of the ureter, inappropriate tachycardia, presented to the clinic today for a follow-up visit.  Patient struggles with his insomnia mostly due to her pain problems.  She reports she had a good night last night.  She however continues to be worried about sleep in general.  Patient also wants to be referred to a clinic closer to [facility] if possible.  Discussed plan as noted below.  Plan Insomnia Continue mirtazapine 7.5 mg p.o. nightly She is on amitriptyline which she takes for ureter /bladder spasms related to her ureteric anomalies.  Discussed with her that it can also help with sleep. Will start Seroquel 12.5-25 mg p.o. nightly as needed if she has a night when she cannot rest at all.  Discussed with her the adverse effect of combining several sleep medications together.  Discussed with her to limit the use of Seroquel as much as possible.  Provided medication education.  She is aware about adverse side effects including orthostatic hypotension.  I have discussed sleep hygiene techniques with patient.  I have also discussed referral for CBT to help with sleep.  She reports she has a history of insomnia since her childhood.  She denies any mood symptoms at this time.  Will refer patient to a closer clinic.  Will make the referral to reach will.  Follow-up in clinic in 2 weeks or sooner if needed.  More than 50 % of the time was spent for psychoeducation and supportive psychotherapy and care coordination.  This note was generated in part or whole with voice recognition software. Voice recognition is usually quite accurate but there are transcription errors that can and very often do occur. I apologize for any  typographical errors that were not detected and corrected.       Jomarie Longs, MD 06/23/2018, 8:27 AM

## 2018-06-23 ENCOUNTER — Encounter: Payer: Self-pay | Admitting: Psychiatry

## 2018-06-25 ENCOUNTER — Encounter: Payer: Self-pay | Admitting: Adult Health

## 2018-06-27 ENCOUNTER — Ambulatory Visit (INDEPENDENT_AMBULATORY_CARE_PROVIDER_SITE_OTHER): Payer: 59 | Admitting: Family Medicine

## 2018-06-27 ENCOUNTER — Ambulatory Visit: Payer: 59 | Admitting: Physical Therapy

## 2018-06-27 ENCOUNTER — Telehealth: Payer: Self-pay

## 2018-06-27 ENCOUNTER — Encounter: Payer: Self-pay | Admitting: Family Medicine

## 2018-06-27 ENCOUNTER — Encounter: Payer: Self-pay | Admitting: Physical Therapy

## 2018-06-27 ENCOUNTER — Telehealth: Payer: Self-pay | Admitting: Psychiatry

## 2018-06-27 VITALS — BP 114/72 | HR 82 | Temp 98.3°F | Ht 64.0 in | Wt 122.6 lb

## 2018-06-27 VITALS — HR 99

## 2018-06-27 DIAGNOSIS — G8929 Other chronic pain: Secondary | ICD-10-CM

## 2018-06-27 DIAGNOSIS — M5442 Lumbago with sciatica, left side: Secondary | ICD-10-CM

## 2018-06-27 DIAGNOSIS — R0789 Other chest pain: Secondary | ICD-10-CM | POA: Diagnosis not present

## 2018-06-27 MED ORDER — TEMAZEPAM 15 MG PO CAPS: 15.0000 mg | ORAL_CAPSULE | Freq: Every evening | ORAL | 0 refills | Status: DC | PRN

## 2018-06-27 NOTE — Telephone Encounter (Signed)
pt called left a message on friday afternoon that she is having side affect from remeron. pt left message that she has not started the seroquel  and that she believed that it is the remeron that is giving her bad dreams , bad taste in her mouth, chest pain on and office

## 2018-06-27 NOTE — Telephone Encounter (Signed)
Returned call to patient. Pt reports she had a bad reaction to Remeron, had chest pain, neck pain and felt bad. She almost went to the ER since she was worried she was going to have a heart attack. Pt reports she does not tolerate medications well and her previous provider tried a lot of sleep aids before giving her Restoril. Discussed with patient to continue Restoril for now since she is being transitioned to another provider. She is also worried about starting other medications since she is worried about side effects. She has her appointment with Dr.Hisada on the 28 th and at that time she can discuss alternative options if needed. Will sent 10 pills of Temazepam to pharmacy - she has supplies at home from a previous script.

## 2018-06-27 NOTE — Patient Instructions (Signed)
It was very nice to see you today!  Your EKG does not have any signs of heart abnormalities.  Please let me know if your symptoms do not improve over the next few days, or if you have any worsening chest pain, shortness of breath, dizziness, or passing out episodes.  Take care, Dr Jimmey Ralph

## 2018-06-27 NOTE — Progress Notes (Signed)
   Subjective:  Christie Hurst is a 42 y.o. female who presents today for same-day appointment with a chief complaint of chest tightness.   HPI:  Chest Tightness, Acute problem Started 3-4 days ago. Recently started remeron a couple of weeks ago for insomnia.  Patient denies any other obvious precipitating events.  She called her psychiatrist today who switched her back to Restoril.  She is still having occasional symptoms.  Described as a "tightness" in the lower chest.  Also feels like a "thumping sensation" that radiates into her neck.  Some shortness of breath.  No nausea or vomiting.  No exertional symptoms.  He was able to complete physical therapy today with no change in her symptoms.  Feels like her heart is racing occasionally.  No reported hemoptysis or leg swelling.  No other obvious alleviating or aggravating factors.   ROS: Per HPI  PMH: She reports that she has never smoked. She has never used smokeless tobacco. She reports that she does not drink alcohol or use drugs.  Objective:  Physical Exam: BP 114/72 (BP Location: Left Arm, Patient Position: Sitting, Cuff Size: Normal)   Pulse 82   Temp 98.3 F (36.8 C) (Oral)   Ht 5\' 4"  (1.626 m)   Wt 122 lb 9.6 oz (55.6 kg)   SpO2 98%   BMI 21.04 kg/m   Gen: NAD, resting comfortably CV: RRR with no murmurs appreciated Pulm: NWOB, CTAB with no crackles, wheezes, or rhonchi MSK: No edema, cyanosis, or clubbing noted Skin: Warm, dry Neuro: Grossly normal, moves all extremities Psych: Normal affect and thought content  EKG: NSR. No acute ischemic changes.   Assessment/Plan:  Chest Tightness Likely anxiety vs medication side effect.  History, physical exam, and EKG not consistent with cardiac etiology.  Wells score 1.5 - doubt PE.Marland Kitchen  Anticipate symptoms will improve over the next few days now that she is back on Restoril.  We will continue with watchful waiting.  Do not think she needs further management at this time.  Discussed  strict reasons to return to care and seek emergent care including worsening shortness of breath, chest pain, syncope, or dizziness.  Time Spent: I spent >35 minutes face-to-face with the patient, with more than half spent on counseling for management plan and etiology for her chest tightness.   Katina Degree. Jimmey Ralph, MD 06/27/2018 1:39 PM

## 2018-06-28 ENCOUNTER — Other Ambulatory Visit: Payer: Self-pay | Admitting: Adult Health

## 2018-06-28 DIAGNOSIS — F419 Anxiety disorder, unspecified: Secondary | ICD-10-CM

## 2018-06-29 ENCOUNTER — Encounter: Payer: Self-pay | Admitting: Physical Therapy

## 2018-06-29 NOTE — Therapy (Signed)
[facility] [address] [place], [place], [address] Phone: [phone]   Fax:  [phone]  Physical Therapy Treatment  Patient Details  Name: TARYN SHELLHAMMER MRN: [id number] Date of Birth: Feb 17, 1976 Referring Provider (PT): Teresa Coombs   Encounter Date: 06/27/2018  PT End of Session - 06/29/18 1037    Visit Number  6    Number of Visits  12    Date for PT Re-Evaluation  06/27/18    Authorization Type  UHC    PT Start Time  1216    PT Stop Time  1300    PT Time Calculation (min)  44 min    Activity Tolerance  Patient tolerated treatment well;Patient limited by pain    Behavior During Therapy  [facility] for tasks assessed/performed       Past Medical History:  Diagnosis Date  . Chronic back pain   . Frequent UTI    d/t congential abnormality   . History of DVT (deep vein thrombosis) 05/2013  . Inappropriate sinus tachycardia   . Phlebitis   . POTS (postural orthostatic tachycardia syndrome)   . Tachycardia   . Urethral spasm   . UTI (urinary tract infection)     Past Surgical History:  Procedure Laterality Date  . CESAREAN SECTION     x 2   . LAMINECTOMY  12/2012   L5/S1  . microdiscetomy  11/2012   L5/S1  . SPINAL FUSION  03/2013   L5/S1  . stent/DVT  05/2013   Left groin    Vitals:   06/29/18 1044  Pulse: 99  SpO2: 98%                      [facility] Adult PT Treatment/Exercise - 06/29/18 0001      Exercises   Exercises  Lumbar      Lumbar Exercises: Stretches   Active Hamstring Stretch  3 reps;30 seconds    Piriformis Stretch  3 reps;30 seconds    Piriformis Stretch Limitations  seated      Lumbar Exercises: Aerobic   Stationary Bike  L1 x 7 min      Lumbar Exercises: Standing   Other Standing Lumbar Exercises  Standing hip abd, ext x20 each bil;       Manual Therapy   Manual Therapy  Soft tissue mobilization;Passive ROM;Manual Traction;Joint mobilization    Soft tissue  mobilization  DTM to L glute/ lateral hip;     Passive ROM  L Hamstring stretch with ankle pump for nerve glide    Manual Traction  light long leg distraction bil; , 10 sec x5;                PT Short Term Goals - 06/16/18 9357      PT SHORT TERM GOAL #1   Title  Pt to report decreased pain in L LE and lumbar region, to 5/10 with activity     Time  3    Period  Weeks    Status  Partially Met      PT SHORT TERM GOAL #2   Title  Pt to be independent with initial HEP     Time  2    Period  Weeks    Status  Achieved        PT Long Term Goals - 05/17/18 1359      PT LONG TERM GOAL #1   Title  Pt to be independent with final HEp  for lumbar spine     Time  6    Period  Weeks    Status  New    Target Date  06/27/18      PT LONG TERM GOAL #2   Title  Pt to demo improved ROM for lumar spine, to be WNL for pt age    Time  6    Period  Weeks    Status  New    Target Date  06/27/18      PT LONG TERM GOAL #3   Title  Pt to report decreased pain in L LE and lumbar region, to 0-3/10 with activity, to improve ablility for IADLs and ADLS.     Time  6    Period  Weeks    Status  New    Target Date  06/27/18      PT LONG TERM GOAL #4   Title  Pt to report increased abiity for standing/walking, for up to 1 mile, with pain <3/10, to improve ability for community navigation.     Time  6    Period  Weeks    Status  New    Target Date  06/27/18            Plan - 06/29/18 1040    Clinical Impression Statement  Light ther ex performed today, decreased activity level, due to pt reporting intermittent symptoms of heart racing at home. She was able to perform light stretching and manual. Recommended that pt see MD today for onset of symptoms. Pt able to stay in office for MD appt today.  Pt with mild soreness in glute region today with manual, but improved from previous sessions. Recommended that pt continue HEP as able. Pain improving, pt will benefit from continued care to  progress strengthening.  Pt seems to be very frustrated about not sleeping, and says that she is exhausted from not sleeping as well. She also states that for the first time in quites some time, it is not the pain in her hip that is keeping her awake.     Rehab Potential  Good    PT Frequency  2x / week    PT Duration  6 weeks    PT Treatment/Interventions  ADLs/Self Care Home Management;Cryotherapy;Electrical Stimulation;Iontophoresis 15m/ml Dexamethasone;Functional mobility training;Stair training;Gait training;Ultrasound;Moist Heat;Therapeutic activities;Therapeutic exercise;Balance training;Neuromuscular re-education;Patient/family education;Passive range of motion;Manual techniques;Dry needling;Taping    PT Next Visit Plan  Teach HEP     Consulted and Agree with Plan of Care  Patient       Patient will benefit from skilled therapeutic intervention in order to improve the following deficits and impairments:  Abnormal gait, Decreased range of motion, Increased muscle spasms, Decreased activity tolerance, Pain, Improper body mechanics, Impaired flexibility, Hypomobility, Decreased mobility, Decreased strength, Difficulty walking  Visit Diagnosis: Chronic bilateral low back pain with left-sided sciatica     Problem List Patient Active Problem List   Diagnosis Date Noted  . Anxiety 06/09/2018  . 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 Chronic pain 06/09/2018  . Degenerative disc disease, lumbar 06/09/2018  . May-Thurner syndrome 06/09/2018  . Radiculopathy 06/09/2018  . Spinal stenosis 06/09/2018  . Tachycardia 06/09/2018  . Failed back syndrome of lumbar spine 04/13/2018  . Urethral spasm   . Chronic back pain   . POTS (postural orthostatic tachycardia syndrome)   . Inappropriate sinus tachycardia   . Frequent UTI   . History of DVT (deep vein thrombosis) 05/17/2013  Lyndee Hensen, PT, DPT 10:51 AM  06/29/18   [facility] [place], [place], [address] Phone: [phone]   Fax:  [phone]  Name: HOANG REICH MRN: [id number] Date of Birth: July 24, 1976

## 2018-06-29 NOTE — Therapy (Deleted)
[facility] [address] [place], [place], [address] Phone: [phone]   Fax:  [phone]  Physical Therapy Treatment  Patient Details  Name: IYONNAH FERRANTE MRN: [id number] Date of Birth: 1976-04-19 Referring Provider (PT): Teresa Coombs   Encounter Date: 06/27/2018  PT End of Session - 06/29/18 1037    Visit Number  6    Number of Visits  12    Date for PT Re-Evaluation  06/27/18    Authorization Type  UHC    PT Start Time  1216    PT Stop Time  1300    PT Time Calculation (min)  44 min    Activity Tolerance  Patient tolerated treatment well;Patient limited by pain    Behavior During Therapy  [facility] for tasks assessed/performed       Past Medical History:  Diagnosis Date  . Chronic back pain   . Frequent UTI    d/t congential abnormality   . History of DVT (deep vein thrombosis) 05/2013  . Inappropriate sinus tachycardia   . Phlebitis   . POTS (postural orthostatic tachycardia syndrome)   . Tachycardia   . Urethral spasm   . UTI (urinary tract infection)     Past Surgical History:  Procedure Laterality Date  . CESAREAN SECTION     x 2   . LAMINECTOMY  12/2012   L5/S1  . microdiscetomy  11/2012   L5/S1  . SPINAL FUSION  03/2013   L5/S1  . stent/DVT  05/2013   Left groin    Vitals:   06/29/18 1044  Pulse: 99  SpO2: 98%                                PT Short Term Goals - 06/16/18 0828      PT SHORT TERM GOAL #1   Title  Pt to report decreased pain in L LE and lumbar region, to 5/10 with activity     Time  3    Period  Weeks    Status  Partially Met      PT SHORT TERM GOAL #2   Title  Pt to be independent with initial HEP     Time  2    Period  Weeks    Status  Achieved        PT Long Term Goals - 05/17/18 1359      PT LONG TERM GOAL #1   Title  Pt to be independent with final HEp for lumbar spine     Time  6    Period  Weeks    Status  New    Target Date   06/27/18      PT LONG TERM GOAL #2   Title  Pt to demo improved ROM for lumar spine, to be WNL for pt age    Time  6    Period  Weeks    Status  New    Target Date  06/27/18      PT LONG TERM GOAL #3   Title  Pt to report decreased pain in L LE and lumbar region, to 0-3/10 with activity, to improve ablility for IADLs and ADLS.     Time  6    Period  Weeks    Status  New    Target Date  06/27/18      PT LONG TERM GOAL #4   Title  Pt  to report increased abiity for standing/walking, for up to 1 mile, with pain <3/10, to improve ability for community navigation.     Time  6    Period  Weeks    Status  New    Target Date  06/27/18            Plan - 06/29/18 1040    Clinical Impression Statement  Light ther ex performed today, decreased activity level, due to pt reporting intermittent symptoms of heart racing at home. She was able to perform light stretching and manual. Recommended that pt see MD today for onset of symptoms. Pt able to stay in office for MD appt today.  Pt with mild soreness in glute region today with manual, but improved from previous sessions. Recommended that pt continue HEP as able. Pain improving, pt will benefit from continued care to progress strengthening.  Pt seems to be very frustrated about not sleeping, and says that she is exhausted from not sleeping as well. She also states that for the first time in quites some time, it is not the pain in her hip that is keeping her awake.     Rehab Potential  Good    PT Frequency  2x / week    PT Duration  6 weeks    PT Treatment/Interventions  ADLs/Self Care Home Management;Cryotherapy;Electrical Stimulation;Iontophoresis 23m/ml Dexamethasone;Functional mobility training;Stair training;Gait training;Ultrasound;Moist Heat;Therapeutic activities;Therapeutic exercise;Balance training;Neuromuscular re-education;Patient/family education;Passive range of motion;Manual techniques;Dry needling;Taping    PT Next Visit Plan   Teach HEP     Consulted and Agree with Plan of Care  Patient       Patient will benefit from skilled therapeutic intervention in order to improve the following deficits and impairments:  Abnormal gait, Decreased range of motion, Increased muscle spasms, Decreased activity tolerance, Pain, Improper body mechanics, Impaired flexibility, Hypomobility, Decreased mobility, Decreased strength, Difficulty walking  Visit Diagnosis: Chronic bilateral low back pain with left-sided sciatica     Problem List Patient Active Problem List   Diagnosis Date Noted  . Anxiety 06/09/2018  . 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 Chronic pain 06/09/2018  . Degenerative disc disease, lumbar 06/09/2018  . May-Thurner syndrome 06/09/2018  . Radiculopathy 06/09/2018  . Spinal stenosis 06/09/2018  . Tachycardia 06/09/2018  . Failed back syndrome of lumbar spine 04/13/2018  . Urethral spasm   . Chronic back pain   . POTS (postural orthostatic tachycardia syndrome)   . Inappropriate sinus tachycardia   . Frequent UTI   . History of DVT (deep vein thrombosis) 05/17/2013    LLyndee Hensen11/13/2019, 10:48 AM  [address] [place] [place] [address]Phone: [phone]  Fax:  [phone] Name: AALYNN ELLITHORPEMRN: [id number] of Birth: [birth year]

## 2018-07-04 ENCOUNTER — Ambulatory Visit: Payer: 59 | Admitting: Physical Therapy

## 2018-07-04 DIAGNOSIS — G8929 Other chronic pain: Secondary | ICD-10-CM | POA: Diagnosis not present

## 2018-07-04 DIAGNOSIS — M5442 Lumbago with sciatica, left side: Secondary | ICD-10-CM

## 2018-07-05 ENCOUNTER — Encounter: Payer: Self-pay | Admitting: Physical Therapy

## 2018-07-05 DIAGNOSIS — Z79899 Other long term (current) drug therapy: Secondary | ICD-10-CM | POA: Diagnosis not present

## 2018-07-05 DIAGNOSIS — G894 Chronic pain syndrome: Secondary | ICD-10-CM | POA: Diagnosis not present

## 2018-07-05 DIAGNOSIS — M961 Postlaminectomy syndrome, not elsewhere classified: Secondary | ICD-10-CM | POA: Diagnosis not present

## 2018-07-05 NOTE — Therapy (Signed)
[facility] [address] [place], [place], [address] Phone: [phone]   Fax:  [phone]  Physical Therapy Treatment  Patient Details  Name: Christie Hurst MRN: [id number] Date of Birth: 1976-01-16 Referring Provider (PT): Teresa Coombs   Encounter Date: 07/04/2018  PT End of Session - 07/05/18 1612    Visit Number  7    Number of Visits  12    Date for PT Re-Evaluation  06/27/18    Authorization Type  UHC    PT Start Time  1100    PT Stop Time  1144    PT Time Calculation (min)  44 min    Activity Tolerance  Patient tolerated treatment well;Patient limited by pain    Behavior During Therapy  [facility] for tasks assessed/performed       Past Medical History:  Diagnosis Date  . Chronic back pain   . Frequent UTI    d/t congential abnormality   . History of DVT (deep vein thrombosis) 05/2013  . Inappropriate sinus tachycardia   . Phlebitis   . POTS (postural orthostatic tachycardia syndrome)   . Tachycardia   . Urethral spasm   . UTI (urinary tract infection)     Past Surgical History:  Procedure Laterality Date  . CESAREAN SECTION     x 2   . LAMINECTOMY  12/2012   L5/S1  . microdiscetomy  11/2012   L5/S1  . SPINAL FUSION  03/2013   L5/S1  . stent/DVT  05/2013   Left groin    There were no vitals filed for this visit.  Subjective Assessment - 07/05/18 1610    Subjective  Pt states that she is back on sleeping med that is helping her sleep, but now pain is worse and she is waking up from pain. Pt also states frustration with pain and family not understanding her being in pian. Recommended that pt talk with counselor for this, pt agrees, name given for this, but pt wants to see her husbands counselor. Pt with increased pain in back and bil hips today from last week.     Patient Stated Goals  Decreased pain    Currently in Pain?  Yes    Pain Score  6     Pain Location  Back    Pain Orientation  Right;Left    Pain  Descriptors / Indicators  Aching;Tightness    Pain Type  Chronic pain    Pain Onset  More than a month ago    Pain Frequency  Intermittent         [facility] PT Assessment - 07/05/18 0001      AROM   Overall AROM Comments  lumbar flexion: significant improvement: pt able to touch toes, increased ROM from lumbar region vs hamstrings.                    [facility] Adult PT Treatment/Exercise - 07/04/18 1104      Exercises   Exercises  Lumbar      Lumbar Exercises: Stretches   Active Hamstring Stretch  --    Piriformis Stretch  --    Piriformis Stretch Limitations  --    Other Lumbar Stretch Exercise  supine Hip flexor stretch (thomas test position) therapist assisted 1 min x3 bil;     Other Lumbar Stretch Exercise  childs pose 30 sec x3;       Lumbar Exercises: Aerobic   Stationary Bike  L2 x 8 min  Lumbar Exercises: Standing   Other Standing Lumbar Exercises  --      Manual Therapy   Manual Therapy  Soft tissue mobilization;Passive ROM;Manual Traction;Joint mobilization    Manual therapy comments  skilled palpation and monitoring of soft tissue during dry needling.     Soft tissue mobilization  --    Passive ROM  L Hamstring stretch with ankle pump for nerve glide    Manual Traction  light long leg distraction bil; , 10 sec x5;        Trigger Point Dry Needling - 07/05/18 1605    Consent Given?  Yes    Muscles Treated Lower Body  Gluteus minimus;Gluteus maximus;Piriformis   lumbar multifidus bil;    Gluteus Maximus Response  Palpable increased muscle length    Gluteus Minimus Response  Palpable increased muscle length    Piriformis Response  Palpable increased muscle length             PT Short Term Goals - 06/16/18 0828      PT SHORT TERM GOAL #1   Title  Pt to report decreased pain in L LE and lumbar region, to 5/10 with activity     Time  3    Period  Weeks    Status  Partially Met      PT SHORT TERM GOAL #2   Title  Pt to be independent with  initial HEP     Time  2    Period  Weeks    Status  Achieved        PT Long Term Goals - 05/17/18 1359      PT LONG TERM GOAL #1   Title  Pt to be independent with final HEp for lumbar spine     Time  6    Period  Weeks    Status  New    Target Date  06/27/18      PT LONG TERM GOAL #2   Title  Pt to demo improved ROM for lumar spine, to be WNL for pt age    Time  6    Period  Weeks    Status  New    Target Date  06/27/18      PT LONG TERM GOAL #3   Title  Pt to report decreased pain in L LE and lumbar region, to 0-3/10 with activity, to improve ablility for IADLs and ADLS.     Time  6    Period  Weeks    Status  New    Target Date  06/27/18      PT LONG TERM GOAL #4   Title  Pt to report increased abiity for standing/walking, for up to 1 mile, with pain <3/10, to improve ability for community navigation.     Time  6    Period  Weeks    Status  New    Target Date  06/27/18            Plan - 07/05/18 1615    Clinical Impression Statement  Pt with variable pain, states frustration with not sleeping , has had recent med changes, as well as varying pain, that have not allowed her to sleep soundly. She states frustration with this for years, and feels exhausted from this. She is seeing new psychologist in near future for management of this and meds. Dry needling done today, as it gave pt significant pain relief in the past, plan to progress strength when pt is  having less pain .     Rehab Potential  Good    PT Frequency  2x / week    PT Duration  6 weeks    PT Treatment/Interventions  ADLs/Self Care Home Management;Cryotherapy;Electrical Stimulation;Iontophoresis 25m/ml Dexamethasone;Functional mobility training;Stair training;Gait training;Ultrasound;Moist Heat;Therapeutic activities;Therapeutic exercise;Balance training;Neuromuscular re-education;Patient/family education;Passive range of motion;Manual techniques;Dry needling;Taping    PT Next Visit Plan  Teach HEP      Consulted and Agree with Plan of Care  Patient       Patient will benefit from skilled therapeutic intervention in order to improve the following deficits and impairments:  Abnormal gait, Decreased range of motion, Increased muscle spasms, Decreased activity tolerance, Pain, Improper body mechanics, Impaired flexibility, Hypomobility, Decreased mobility, Decreased strength, Difficulty walking  Visit Diagnosis: Chronic bilateral low back pain with left-sided sciatica     Problem List Patient Active Problem List   Diagnosis Date Noted  . Anxiety 06/09/2018  . 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 Chronic pain 06/09/2018  . Degenerative disc disease, lumbar 06/09/2018  . May-Thurner syndrome 06/09/2018  . Radiculopathy 06/09/2018  . Spinal stenosis 06/09/2018  . Tachycardia 06/09/2018  . Failed back syndrome of lumbar spine 04/13/2018  . Urethral spasm   . Chronic back pain   . POTS (postural orthostatic tachycardia syndrome)   . Inappropriate sinus tachycardia   . Frequent UTI   . History of DVT (deep vein thrombosis) 05/17/2013    LLyndee Hensen PT, DPT 4:29 PM  07/05/18    [place] [place] [address]Phone: [phone]  Fax:  [phone] Name: Christie POTTEIGERMRN: [id number] of Birth: 1November 03, 1977

## 2018-07-06 ENCOUNTER — Ambulatory Visit: Payer: 59 | Admitting: Psychiatry

## 2018-07-07 ENCOUNTER — Telehealth: Payer: Self-pay | Admitting: Physical Therapy

## 2018-07-07 ENCOUNTER — Other Ambulatory Visit: Payer: Self-pay | Admitting: Psychiatry

## 2018-07-07 NOTE — Telephone Encounter (Signed)
See note  Copied from CRM [phone]. Topic: General - Other >> Jul 07, 2018  9:28 AM Percival SpanishKennedy, Cheryl W wrote:  Pt said she saw PT on Monday and has been in severe pain and is asking if Lauren could call her

## 2018-07-12 ENCOUNTER — Encounter: Payer: 59 | Admitting: Physical Therapy

## 2018-07-12 NOTE — Progress Notes (Signed)
BH MD/PA/NP OP Progress Note  07/13/2018 12:53 PM Christie Hurst  MRN:  [id number]  Chief Complaint:  Chief Complaint    Follow-up; Insomnia     HPI:  Christie Hurst is a 42 y.o. year old female with a history of insomnia,chronic pain syndrome, multiple back surgeries with complications, urethral spasm, congenital anomaly of the ureter, inappropriate tachycardia,  who presents for follow up appointment for Insomnia, unspecified type.  Patient is transferred from Dr. Michae Kava.   Patient states that she has been finally feeling normal after trials of medication.  She has had adverse reaction to several medication as listed below.  She has been on combination of clonazepam and temazepam for insomnia for 5 years before she moved from [place] to [place].  She moved here with her family as her husband was transferred for work in August.  She enjoys living in [place], and she does not miss them live in [place].  She reports good relationship with her 2 children (age 20,18).  Although she has good relationship with her husband, she has occasional conflict due to his perception of her back pain. Husband and her family in law believes that she can go back to work. She feels that "they don't listen to me." She had back surrgey in 2014, and the course was complicated with DVT, PE.  She was in ICU and was hospitalized for 1 year. 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  She believes that she has finally come to the better version of herself.  Although she still struggles with moving due to her pain, she believes she has accepted it.  She hopes to go to school to become a Publishing rights manager.  Although she shared it with her husband, she thinks that her husband cannot wait.  She also feels anxious about upcoming visit to [place] to meet with her family in law.  She knows that her anxiety will be worse, although her husband cannot understanding. Her father  understands that the patient may not be able to visit as often due to her back pain.   She has middle insomnia if she does not take clonazepam or temazepam. Although Belbuca helps sigificantly for her pain, it causes insomnia. She denies feeling depressed.  She occasionally feels anxious.  She denies panic attacks.  She denies SI. She denies alcohol use or drug use.  .   On disability for chronic pain,  S/p three surgeries  (Medication trials) Mirtazapine- chest pain.  Quetiapine- "sick" Baclofen- crazy dreams  Visit Diagnosis:    ICD-10-CM   1. Insomnia, unspecified type G47.00     Past Psychiatric History:  Outpatient:  Psychiatry admission: denies  Previous suicide attempt: denies  Past trials of medication: citalopram (jaw stiffness), sertraline (restless), fluoxetine (restless) hydroxyzine (hyperactive), melatonin, Ambien (middle insomnia),  trazodone, temazepam, clonazepam History of violence: denies   Past Medical History:  Past Medical History:  Diagnosis Date  . Chronic back pain   . Frequent UTI    d/t congential abnormality   . History of DVT (deep vein thrombosis) 05/2013  . Inappropriate sinus tachycardia   . Phlebitis   . POTS (postural orthostatic tachycardia syndrome)   . Tachycardia   . Urethral spasm   . UTI (urinary tract infection)     Past Surgical History:  Procedure Laterality Date  . CESAREAN SECTION     x 2   . LAMINECTOMY  12/2012   L5/S1  . microdiscetomy  11/2012   L5/S1  . SPINAL FUSION  03/2013   L5/S1  . stent/DVT  05/2013   Left groin    Family Psychiatric History:  Please see initial evaluation for full details. I have reviewed the history. No updates at this time.     Family History:  Family History  Problem Relation Age of Onset  . Early death Mother 22  . Transient ischemic attack Mother   . Depression Mother   . Mental illness Mother   . Transient ischemic attack Father   . Breast cancer Paternal Grandmother   .  Breast cancer Paternal Aunt   . Breast cancer Paternal Aunt     Social History:  Social History   Socioeconomic History  . Marital status: Married    Spouse name: jamie  . Number of children: 2  . Years of education: Not on file  . Highest education level: Bachelor's degree (e.g., BA, AB, BS)  Occupational History  . Not on file  Social Needs  . Financial resource strain: Not hard at all  . Food insecurity:    Worry: Never true    Inability: Never true  . Transportation needs:    Medical: No    Non-medical: No  Tobacco Use  . Smoking status: Never Smoker  . Smokeless tobacco: Never Used  Substance and Sexual Activity  . Alcohol use: Never    Frequency: Never  . Drug use: Never  . Sexual activity: Yes  Lifestyle  . Physical activity:    Days per week: 0 days    Minutes per session: 0 min  . Stress: Very much  Relationships  . Social connections:    Talks on phone: Not on file    Gets together: Not on file    Attends religious service: Never    Active member of club or organization: No    Attends meetings of clubs or organizations: Never    Relationship status: Married  Other Topics Concern  . 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 Tamsulosin Hypertension, Photosensitivity, Rash and Shortness Of Breath  . Tramadol Anxiety, Palpitations and Shortness Of Breath  . Cephalexin Hives  . Duloxetine Hcl Other (See Comments)  . Lamotrigine Photosensitivity  . Remeron [Mirtazapine]     Chest pain  . Seroquel [Quetiapine Fumarate]     "out of it" it opposite of what it was suppose to do  . Sulfamethoxazole-Trimethoprim Hives and Palpitations    Metabolic Disorder Labs: No results found for: HGBA1C, MPG No results found for: PROLACTIN No results found for: CHOL, TRIG, HDL, CHOLHDL, VLDL, LDLCALC No results found for: TSH  Therapeutic Level  Labs: No results found for: LITHIUM No results found for: VALPROATE No components found for:  CBMZ  Current Medications: Current Outpatient Medications  Medication Sig Dispense Refill  . acebutolol (SECTRAL) 200 MG capsule Take 1 capsule (200 mg total) by mouth daily. 90 capsule 1  . amitriptyline (ELAVIL) 50 MG tablet Take 1 tablet (50 mg total) by mouth at bedtime. Patient has supplies 30 tablet 0  . Buprenorphine HCl (BELBUCA) 150 MCG FILM Place 150 mcg inside cheek daily.     . clonazePAM (KLONOPIN) 0.5 MG tablet Take 1 tablet (0.5 mg total) by mouth daily as needed for  anxiety. 30 tablet 1  . nitrofurantoin (MACRODANTIN) 100 MG capsule Take 1 capsule (100 mg total) by mouth daily. 90 capsule 3  . olopatadine (PATANOL) 0.1 % ophthalmic solution Place 1 drop into both eyes 2 (two) times daily. (Patient taking differently: Place 1 drop into both eyes daily. ) 5 mL 1  . temazepam (RESTORIL) 15 MG capsule Take 1 capsule (15 mg total) by mouth at bedtime as needed for sleep. 30 capsule 1  . BACLOFEN PO Take 10 mg by mouth 3 (three) times daily.    Marland Kitchen HYDROmorphone (DILAUDID) 2 MG tablet Take 1 tablet (2 mg total) by mouth every 6 (six) hours as needed for severe pain. (Patient not taking: Reported on 07/13/2018) 20 tablet 0   No current facility-administered medications for this visit.      Musculoskeletal: Strength & Muscle Tone: within normal limits Gait & Station: normal Patient leans: N/A  Psychiatric Specialty Exam: Review of Systems  Musculoskeletal: Positive for back pain.  Psychiatric/Behavioral: Negative for depression, hallucinations, memory loss, substance abuse and suicidal ideas. The patient is nervous/anxious. The patient does not have insomnia.   All other systems reviewed and are negative.   Blood pressure 106/72, pulse 90, height 5\' 4"  (1.626 m), weight 126 lb (57.2 kg), SpO2 100 %.Body mass index is 21.63 kg/m.  General Appearance: Fairly Groomed  Eye Contact:   Good  Speech:  Clear and Coherent  Volume:  Normal  Mood:  "finally good"  Affect:  Appropriate, Congruent and slightly tense  Thought Process:  Coherent  Orientation:  Full (Time, Place, and Person)  Thought Content: Logical   Suicidal Thoughts:  No  Homicidal Thoughts:  No  Memory:  Immediate;   Good  Judgement:  Good  Insight:  Good  Psychomotor Activity:  Normal  Concentration:  Concentration: Good and Attention Span: Good  Recall:  Good  Fund of Knowledge: Good  Language: Good  Akathisia:  No  Handed:  Right  AIMS (if indicated): not done  Assets:  Communication Skills Desire for Improvement  ADL's:  Intact  Cognition: WNL  Sleep:  Good on temazepam, clonazepam   Screenings:   Assessment and Plan:  ATHENIA RYS is a 42 y.o. year old female with a history of insomnia,chronic pain syndrome, multiple back surgeries with complications, urethral spasm, congenital anomaly of the ureter, inappropriate tachycardia,  who presents for follow up appointment for insomnia, anxiety.   # Insomnia Patient has middle insomnia, which she attributes to her pain and side effect from [facility]. Patient has had adverse reaction to several medication for insomnia. Although it is preferable not to use benzodiazepine for insomnia, especially with concomitant use of buprenorphine, will continue clonazepam and temazepam for insomnia at this time. Discussed potential significant risk of respiratory suppression. The hope is to at least consolidate to either one of the medication when the patient is amenable. Will continue to discuss.  # r/o Adjustment disorder with anxiety Patient reports occasional anxiety in the context of conflict with her family in law. She also does have other psychosocial stressors, which includes pain and unemployment, although she has been responding to those well. Discussed self compassion.   Plan 1. Continue clonazepam 0.5 mg daily as needed for anxiety 2. Continue  temazepam 15 mg at night as needed for anxiety 3. Return to clinic in two months for 30 mins - on buprenorphine, amitriptyline 25 mg at night (limited benefit from higher dose) - explore developmental history at the next visit  The patient demonstrates the following risk factors for suicide: Chronic risk factors for suicide include: psychiatric disorder of anxiety and chronic pain. Acute risk factors for suicide include: family or marital conflict and unemployment. Protective factors for this patient include: positive social support, responsibility to others (children, family), coping skills and hope for the future. Considering these factors, the overall suicide risk at this point appears to be low. Patient is appropriate for outpatient follow up.  The duration of this appointment visit was 30 minutes of face-to-face time with the patient.  Greater than 50% of this time was spent in counseling, explanation of  diagnosis, planning of further management, and coordination of care.  Neysa Hottereina Jakya Dovidio, MD 07/13/2018, 12:53 PM

## 2018-07-13 ENCOUNTER — Encounter (HOSPITAL_COMMUNITY): Payer: Self-pay | Admitting: Psychiatry

## 2018-07-13 ENCOUNTER — Ambulatory Visit (INDEPENDENT_AMBULATORY_CARE_PROVIDER_SITE_OTHER): Payer: 59 | Admitting: Psychiatry

## 2018-07-13 VITALS — BP 106/72 | HR 90 | Ht 64.0 in | Wt 126.0 lb

## 2018-07-13 DIAGNOSIS — G4700 Insomnia, unspecified: Secondary | ICD-10-CM

## 2018-07-13 DIAGNOSIS — F4322 Adjustment disorder with anxiety: Secondary | ICD-10-CM | POA: Insufficient documentation

## 2018-07-13 MED ORDER — TEMAZEPAM 15 MG PO CAPS: 15.0000 mg | ORAL_CAPSULE | Freq: Every evening | ORAL | 1 refills | Status: DC | PRN

## 2018-07-13 MED ORDER — CLONAZEPAM 0.5 MG PO TABS: 0.5000 mg | ORAL_TABLET | Freq: Every day | ORAL | 1 refills | Status: DC | PRN

## 2018-07-13 NOTE — Patient Instructions (Signed)
1. Continue clonazepam 0.5 mg daily as needed for anxiety 2. Continue temazepam 15 mg at night as needed for anxiety 3. Return to clinic in two months for 30 mins

## 2018-07-18 ENCOUNTER — Ambulatory Visit: Payer: 59 | Admitting: Physical Therapy

## 2018-07-18 ENCOUNTER — Encounter: Payer: Self-pay | Admitting: Physical Therapy

## 2018-07-18 DIAGNOSIS — G8929 Other chronic pain: Secondary | ICD-10-CM

## 2018-07-18 DIAGNOSIS — M5442 Lumbago with sciatica, left side: Secondary | ICD-10-CM | POA: Diagnosis not present

## 2018-07-18 NOTE — Therapy (Signed)
[facility] [address] [place], [place], [address] Phone: [phone]   Fax:  [phone]  Physical Therapy Treatment/Re-Cert   Patient Details  Name: Christie Hurst MRN: [id number] Date of Birth: [birth year] Referring Provider (PT): Teresa Coombs   Encounter Date: 07/18/2018  PT End of Session - 07/18/18 1035    Visit Number  8    Number of Visits  12    Date for PT Re-Evaluation  08/15/18    Authorization Type  [employer]    PT Start Time  1015    PT Stop Time  1058    PT Time Calculation (min)  43 min    Activity Tolerance  Patient tolerated treatment well;Patient limited by pain    Behavior During Therapy  [facility] for tasks assessed/performed       Past Medical History:  Diagnosis Date  . Chronic back pain   . Frequent UTI    d/t congential abnormality   . History of DVT (deep vein thrombosis) 05/2013  . Inappropriate sinus tachycardia   . Phlebitis   . POTS (postural orthostatic tachycardia syndrome)   . Tachycardia   . Urethral spasm   . UTI (urinary tract infection)     Past Surgical History:  Procedure Laterality Date  . CESAREAN SECTION     x 2   . LAMINECTOMY  12/2012   L5/S1  . microdiscetomy  11/2012   L5/S1  . SPINAL FUSION  03/2013   L5/S1  . stent/DVT  05/2013   Left groin    There were no vitals filed for this visit.  Subjective Assessment - 07/18/18 1022    Subjective  Pt states she is now on medication that is helping her sleep better. She is still waking up with pain, states 1x/night. She states after last visit that she had significant pain in her L LE, for several days, much improved now. She also states that she continues to have pain for about 3 days, after sitting at daughters basketball games for 1-2 hours. She reports doing HEP, and has been taking pain meds perscribled by MD. Overall pt states that pain is "much better" than it was previously.     Limitations  Sitting;House hold activities    Patient Stated Goals  Decreased pain    Currently in Pain?  Yes    Pain Score  3     Pain Location  Back    Pain Orientation  Right;Left    Pain Descriptors / Indicators  Aching;Tightness    Pain Type  Chronic pain    Pain Onset  More than a month ago    Pain Frequency  Intermittent    Aggravating Factors   Sitting, household activities, IADLS.     Pain Relieving Factors  none    Multiple Pain Sites  Yes    Pain Score  5    Pain Location  Leg    Pain Orientation  Left    Pain Descriptors / Indicators  Burning;Tightness    Pain Type  Chronic pain    Pain Onset  More than a month ago    Pain Frequency  Intermittent         [facility] PT Assessment - 07/18/18 0001      AROM   Overall AROM Comments  Lumbar ROM: Mild limitation for Ext;  Hip: WNL;  Hamstring length:significantly improved.       Palpation   Palpation comment  Tenderness in lumbar spine (center/spinous process)  Special Tests   Other special tests  Neg LLTT on L; Able to perform Nerve glides with minmial tension;                    [facility] Adult PT Treatment/Exercise - 07/18/18 1019      Exercises   Exercises  Lumbar      Lumbar Exercises: Stretches   Other Lumbar Stretch Exercise  supine Hip flexor stretch (thomas test position) therapist assisted 1 min x2 bil;     Other Lumbar Stretch Exercise  childs pose 30 sec x3;       Lumbar Exercises: Aerobic   Stationary Bike  L2 x 8 min      Lumbar Exercises: Supine   Bridge  20 reps      Lumbar Exercises: Sidelying   Hip Abduction  20 reps;Both      Lumbar Exercises: Quadruped   Madcat/Old Horse  15 reps      Manual Therapy   Manual Therapy  Soft tissue mobilization;Passive ROM;Manual Traction;Joint mobilization    Manual therapy comments  --    Soft tissue mobilization  DTM and IASTM to Bil Lumbar region;  Roller to L glute and ITB;     Passive ROM  L Hamstring stretch with ankle pump for nerve glide    Manual Traction  light long leg  distraction bil; , 10 sec x5;              PT Education - 07/18/18 1035    Education Details  HEP review, PT POC     Person(s) Educated  Patient    Methods  Explanation    Comprehension  Verbalized understanding       PT Short Term Goals - 07/18/18 1036      PT SHORT TERM GOAL #1   Title  Pt to report decreased pain in L LE and lumbar region, to 5/10 with activity     Time  3    Period  Weeks    Status  Partially Met      PT SHORT TERM GOAL #2   Title  Pt to be independent with initial HEP     Time  2    Period  Weeks    Status  Achieved        PT Long Term Goals - 07/18/18 1036      PT LONG TERM GOAL #1   Title  Pt to be independent with final HEp for lumbar spine     Time  6    Period  Weeks    Status  Partially Met      PT LONG TERM GOAL #2   Title  Pt to demo improved ROM for lumar spine, to be WNL for pt age    Time  6    Period  Weeks    Status  Partially Met      PT LONG TERM GOAL #3   Title  Pt to report decreased pain in L LE and lumbar region, to 0-3/10 with activity, to improve ablility for IADLs and ADLS.     Time  6    Period  Weeks    Status  On-going      PT LONG TERM GOAL #4   Title  Pt to report increased abiity for standing/walking, for up to 1 mile, with pain <3/10, to improve ability for community navigation.     Time  6    Period  Weeks  Status  On-going            Plan - 07/18/18 1152    Clinical Impression Statement  ReCert done today. Pt has made good improvements in some areas. She has significantly improved mobility, of lumbar spine and hips, and has much improved ROM for lumbar region, as well as hamstrings. She has ROM now WNL, but continues to have pain daily, and increased pain with minimal activity, such as sitting for 1 hour. She has gotten good pain relief from previous sessions, but pain continues to be variable. She has been unable to progress to much strengthening/stabilization, due to continued pain. Pt does  not have significant weakness or instability, but would benefit from doing HEP for light strength. Continued to discuss and recommend counseling for pt, to discuss decreasing and managing stress, as this is likely part of her chronic pain. Pt to benefit from continued care, for about 2 weeks, to improve pain, and LE pain, and to teach Core strengthening for final HEP. Plan to work towards d/c to HEP.     Rehab Potential  Good    Clinical Impairments Affecting Rehab Potential  Recommended pt come 2x/wk, but pt only able to attend 1x/wk . Has been seen for 8 visits since 05/16/18.     PT Frequency  1x / week    PT Duration  4 weeks    PT Treatment/Interventions  ADLs/Self Care Home Management;Cryotherapy;Electrical Stimulation;Iontophoresis 37m/ml Dexamethasone;Functional mobility training;Stair training;Gait training;Ultrasound;Moist Heat;Therapeutic activities;Therapeutic exercise;Balance training;Neuromuscular re-education;Patient/family education;Passive range of motion;Manual techniques;Dry needling;Taping    Consulted and Agree with Plan of Care  Patient       Patient will benefit from skilled therapeutic intervention in order to improve the following deficits and impairments:  Abnormal gait, Decreased range of motion, Increased muscle spasms, Decreased activity tolerance, Pain, Improper body mechanics, Impaired flexibility, Hypomobility, Decreased mobility, Decreased strength, Difficulty walking  Visit Diagnosis: Chronic bilateral low back pain with left-sided sciatica     Problem List Patient Active Problem List   Diagnosis Date Noted  . Insomnia 07/13/2018  . Anxiety 06/09/2018  . Chronic kidney disease due to systemic infection ([place] 06/09/2018  . Chronic pain 06/09/2018  . Degenerative disc disease, lumbar 06/09/2018  . May-Thurner syndrome 06/09/2018  . Radiculopathy 06/09/2018  . Spinal stenosis 06/09/2018  . Tachycardia 06/09/2018  . Failed back syndrome of lumbar spine  04/13/2018  . Urethral spasm   . Chronic back pain   . POTS (postural orthostatic tachycardia syndrome)   . Inappropriate sinus tachycardia   . Frequent UTI   . History of DVT (deep vein thrombosis) 05/17/2013   LLyndee Hensen PT, DPT 12:02 PM  07/18/18    [facility] [address]Phone: [phone]  Fax:  [phone] Name: Christie BERROAMRN: [id number] of Birth: 1Jun 28, 1977

## 2018-07-22 ENCOUNTER — Other Ambulatory Visit: Payer: Self-pay | Admitting: Family Medicine

## 2018-07-22 MED ORDER — OLOPATADINE HCL 0.1 % OP SOLN: 1.0000 [drp] | Freq: Every day | OPHTHALMIC | 3 refills | Status: DC

## 2018-07-25 ENCOUNTER — Encounter: Payer: Self-pay | Admitting: Sports Medicine

## 2018-07-25 ENCOUNTER — Ambulatory Visit: Payer: 59 | Admitting: Physical Therapy

## 2018-07-25 ENCOUNTER — Ambulatory Visit (INDEPENDENT_AMBULATORY_CARE_PROVIDER_SITE_OTHER): Payer: 59 | Admitting: Sports Medicine

## 2018-07-25 ENCOUNTER — Encounter: Payer: Self-pay | Admitting: Physical Therapy

## 2018-07-25 VITALS — BP 102/78 | HR 110 | Ht 64.0 in | Wt 125.8 lb

## 2018-07-25 DIAGNOSIS — G8929 Other chronic pain: Secondary | ICD-10-CM

## 2018-07-25 DIAGNOSIS — M545 Low back pain, unspecified: Secondary | ICD-10-CM

## 2018-07-25 DIAGNOSIS — M5442 Lumbago with sciatica, left side: Secondary | ICD-10-CM

## 2018-07-25 DIAGNOSIS — M961 Postlaminectomy syndrome, not elsewhere classified: Secondary | ICD-10-CM | POA: Diagnosis not present

## 2018-07-25 DIAGNOSIS — Q079 Congenital malformation of nervous system, unspecified: Secondary | ICD-10-CM

## 2018-07-25 NOTE — Therapy (Signed)
[facility] [address] [place], [place], [address] Phone: [phone]   Fax:  [phone]  Physical Therapy Treatment  Patient Details  Name: Christie Hurst MRN: [id number] Date of Birth: 09-09-1975 Referring Provider (PT): Teresa Coombs   Encounter Date: 07/25/2018  PT End of Session - 07/25/18 1255    Visit Number  9    Number of Visits  12    Date for PT Re-Evaluation  08/15/18    Authorization Type  [employer]    PT Start Time  5597    PT Stop Time  1257    PT Time Calculation (min)  44 min    Activity Tolerance  Patient tolerated treatment well;Patient limited by pain    Behavior During Therapy  [facility] for tasks assessed/performed       Past Medical History:  Diagnosis Date  . Chronic back pain   . Frequent UTI    d/t congential abnormality   . History of DVT (deep vein thrombosis) 05/2013  . Inappropriate sinus tachycardia   . Phlebitis   . POTS (postural orthostatic tachycardia syndrome)   . Tachycardia   . Urethral spasm   . UTI (urinary tract infection)     Past Surgical History:  Procedure Laterality Date  . CESAREAN SECTION     x 2   . LAMINECTOMY  12/2012   L5/S1  . microdiscetomy  11/2012   L5/S1  . SPINAL FUSION  03/2013   L5/S1  . stent/DVT  05/2013   Left groin    There were no vitals filed for this visit.  Subjective Assessment - 07/25/18 1254    Subjective  Pt states she is doing better now, but had quite a bit of pain for a few days in a row, due to sitting at daughters game, and going to a friends house. She finds it very difficult to be comfortable in other environments/chairs.     Currently in Pain?  Yes    Pain Score  5     Pain Orientation  Right;Left    Pain Descriptors / Indicators  Aching;Tightness    Pain Type  Chronic pain    Pain Onset  More than a month ago                       [facility] Adult PT Treatment/Exercise - 07/25/18 1222      Exercises   Exercises  Lumbar       Lumbar Exercises: Stretches   Active Hamstring Stretch  3 reps;30 seconds    Double Knee to Chest Stretch  3 reps;30 seconds      Lumbar Exercises: Aerobic   Stationary Bike  L2 x 8 min      Lumbar Exercises: Standing   Other Standing Lumbar Exercises  Hip abd 2x10 bil; March x20; Walk/March Fwd/Bwd 15 ft x4;  SLS: 30 sec x2 bil;       Lumbar Exercises: Seated   Other Seated Lumbar Exercises  Slump x15 bil;       Lumbar Exercises: Supine   Bent Knee Raise  20 reps    Bridge  20 reps      Lumbar Exercises: Sidelying   Hip Abduction  20 reps;Both      Lumbar Exercises: Prone   Other Prone Lumbar Exercises  Prone hip ext x10 bil;  Prone HScurl x15 bil;       Manual Therapy   Manual Therapy  Soft tissue mobilization;Passive  ROM;Manual Traction;Joint mobilization    Soft tissue mobilization  DTM to bil Lumbar paraspinals;                PT Short Term Goals - 07/18/18 1036      PT SHORT TERM GOAL #1   Title  Pt to report decreased pain in L LE and lumbar region, to 5/10 with activity     Time  3    Period  Weeks    Status  Partially Met      PT SHORT TERM GOAL #2   Title  Pt to be independent with initial HEP     Time  2    Period  Weeks    Status  Achieved        PT Long Term Goals - 07/18/18 1036      PT LONG TERM GOAL #1   Title  Pt to be independent with final HEp for lumbar spine     Time  6    Period  Weeks    Status  Partially Met      PT LONG TERM GOAL #2   Title  Pt to demo improved ROM for lumar spine, to be WNL for pt age    Time  6    Period  Weeks    Status  Partially Met      PT LONG TERM GOAL #3   Title  Pt to report decreased pain in L LE and lumbar region, to 0-3/10 with activity, to improve ablility for IADLs and ADLS.     Time  6    Period  Weeks    Status  On-going      PT LONG TERM GOAL #4   Title  Pt to report increased abiity for standing/walking, for up to 1 mile, with pain <3/10, to improve ability for community  navigation.     Time  6    Period  Weeks    Status  On-going            Plan - 07/25/18 1257    Clinical Impression Statement  Pt with improving ROM and mobility. She has difficulty today with Single leg activity and stablity on L vs R. Recommended she add standing exercises to HEP for improving strength and co-contraction of hamstring. Plan to see pt for 1-2 more visits for finalizing HEP and pain relief.     Rehab Potential  Good    Clinical Impairments Affecting Rehab Potential  Recommended pt come 2x/wk, but pt only able to attend 1x/wk . Has been seen for 8 visits since 05/16/18.     PT Frequency  1x / week    PT Duration  4 weeks    PT Treatment/Interventions  ADLs/Self Care Home Management;Cryotherapy;Electrical Stimulation;Iontophoresis 15m/ml Dexamethasone;Functional mobility training;Stair training;Gait training;Ultrasound;Moist Heat;Therapeutic activities;Therapeutic exercise;Balance training;Neuromuscular re-education;Patient/family education;Passive range of motion;Manual techniques;Dry needling;Taping    Consulted and Agree with Plan of Care  Patient       Patient will benefit from skilled therapeutic intervention in order to improve the following deficits and impairments:  Abnormal gait, Decreased range of motion, Increased muscle spasms, Decreased activity tolerance, Pain, Improper body mechanics, Impaired flexibility, Hypomobility, Decreased mobility, Decreased strength, Difficulty walking  Visit Diagnosis: Chronic bilateral low back pain with left-sided sciatica     Problem List Patient Active Problem List   Diagnosis Date Noted  . Insomnia 07/13/2018  . Anxiety 06/09/2018  . Chronic kidney disease due to systemic infection ([place] 06/09/2018  .  Chronic pain 06/09/2018  . Degenerative disc disease, lumbar 06/09/2018  . May-Thurner syndrome 06/09/2018  . Radiculopathy 06/09/2018  . Spinal stenosis 06/09/2018  . Tachycardia 06/09/2018  . Failed back syndrome of  lumbar spine 04/13/2018  . Urethral spasm   . Chronic back pain   . POTS (postural orthostatic tachycardia syndrome)   . Inappropriate sinus tachycardia   . Frequent UTI   . History of DVT (deep vein thrombosis) 05/17/2013    Lyndee Hensen, PT, DPT 1:02 PM  07/25/18    [facility] [place], [place], [address] Phone: [phone]   Fax:  [phone]  Name: Christie Hurst MRN: [id number] Date of Birth: March 31, 1976

## 2018-07-27 DIAGNOSIS — G894 Chronic pain syndrome: Secondary | ICD-10-CM | POA: Diagnosis not present

## 2018-07-27 DIAGNOSIS — Z79899 Other long term (current) drug therapy: Secondary | ICD-10-CM | POA: Diagnosis not present

## 2018-07-27 DIAGNOSIS — M961 Postlaminectomy syndrome, not elsewhere classified: Secondary | ICD-10-CM | POA: Diagnosis not present

## 2018-07-31 NOTE — Progress Notes (Signed)
Christie Hurst. Christie Hurst Sports Medicine [facility] at [facility] [phone]  Christie Hurst - 42 y.o. female MRN [id number]  Date of birth: 1976/07/09  Visit Date: 07/25/2018  PCP: Shirline Frees, NP   Referred by: Shirline Frees, NP   SUBJECTIVE:   Chief Complaint  Patient presents with  . f/u LBP    Sx are improving, intermittently less pain. Still has trouble at night. Able to move more. Continues to radiate into LLE. Taking Belbuca daily and Dilaudid prn with minimal relief. Doing HEP provided by PT, mostly stretching. Strengthening seems to flare up pain.    HPI: This time patient reports that she is doing better than she has in the past.  Physical therapy has been beneficial.  Still has marked pain with straight leg raise slight motion.  She is sleeping better and is more functional during the day except for when this does flareup which is less often.  REVIEW OF SYSTEMS: Pt denies any changes in bowel or bladder habits, no worsening muscle weakness, numbness or falls associated with this pain.  HISTORY:  Prior history reviewed and updated per electronic medical record.  Social History   Occupational History  . Not on file  Tobacco Use  . Smoking status: Never Smoker  . Smokeless tobacco: Never Used  Substance and Sexual Activity  . Alcohol use: Never    Frequency: Never  . Drug use: Never  . Sexual activity: Yes   Social History   Social History Narrative   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    Two children ( 15 & 17)      DATA OBTAINED & REVIEWED:  No results for input(s): HGBA1C, LABURIC, CREATINE, CALCIUM, AST, ALT, TSH in the last 8760 hours.  Invalid input(s): MAGNESIUM, CK No problems updated. No specialty comments available.  OBJECTIVE:  VS:  HT:5\' 4"  (162.6 cm)   WT:125 lb 12.8 oz (57.1 kg)  BMI:21.58    BP:102/78  HR:(!) 110bpm  TEMP: ( )  RESP:96 %   PHYSICAL EXAM: She is sitting comfortably in no acute  distress.  Alert and appropriately interactive. She walks with a normal gait although slightly crouched forward.  She has altered sensation and positive straight leg raise on the left side she is able to heel toe walk however.   ASSESSMENT   1. Chronic bilateral low back pain with left-sided sciatica   2. Failed back syndrome of lumbar spine   3. 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 Chronic midline low back pain without sciatica     PLAN:  Pertinent additional documentation may be included in corresponding procedure notes, imaging studies, problem based documentation and patient instructions.  Procedures:  None  Medications:  No orders of the defined types were placed in this encounter.   Discussion/Instructions:  She is responding to physical therapy.  The osteopathic manipulation was unfortunately symptomatic producing and this will be deferred at this time.  She should continue with a dry needling and at this time no further intervention indicated given the improvement in her symptoms.  Discussed the underlying features of tight hip flexors leading to crouched, fetal like position that results in spinal column compression.  Including lumbar hyperflexion with hypermobility, thoracic flexion with restrictive rotation and cervical lordosis reversal.    If any lack of improvement: consider further diagnostic evaluation with Repeat MRI of the lumbar spine for versus referral for epidural steroid  injection   Return if symptoms worsen or fail to improve.          Andrena MewsMichael D Marwin Primmer, DO    [facility] Physician

## 2018-08-01 ENCOUNTER — Encounter: Payer: 59 | Admitting: Physical Therapy

## 2018-08-15 ENCOUNTER — Encounter: Payer: Self-pay | Admitting: Physical Therapy

## 2018-08-15 ENCOUNTER — Ambulatory Visit: Payer: 59 | Admitting: Physical Therapy

## 2018-08-15 DIAGNOSIS — M5442 Lumbago with sciatica, left side: Secondary | ICD-10-CM | POA: Diagnosis not present

## 2018-08-15 DIAGNOSIS — G8929 Other chronic pain: Secondary | ICD-10-CM

## 2018-08-15 NOTE — Therapy (Signed)
[facility] [address] [place], [place], [address] Phone: [phone]   Fax:  [phone]  Physical Therapy Treatment/Discharge   Patient Details  Name: Christie Hurst MRN: [id number] Date of Birth: 12-26-1975 Referring Provider (PT): Teresa Coombs   Encounter Date: 08/15/2018  PT End of Session - 08/15/18 1159    Visit Number  10    Number of Visits  12    Date for PT Re-Evaluation  08/15/18    Authorization Type  [employer]HC    PT Start Time  1100    PT Stop Time  1144    PT Time Calculation (min)  44 min    Activity Tolerance  Patient tolerated treatment well;Patient limited by pain    Behavior During Therapy  [facility] for tasks assessed/performed       Past Medical History:  Diagnosis Date  . Chronic back pain   . Frequent UTI    d/t congential abnormality   . History of DVT (deep vein thrombosis) 05/2013  . Inappropriate sinus tachycardia   . Phlebitis   . POTS (postural orthostatic tachycardia syndrome)   . Tachycardia   . Urethral spasm   . UTI (urinary tract infection)     Past Surgical History:  Procedure Laterality Date  . CESAREAN SECTION     x 2   . LAMINECTOMY  12/2012   L5/S1  . microdiscetomy  11/2012   L5/S1  . SPINAL FUSION  03/2013   L5/S1  . stent/DVT  05/2013   Left groin    There were no vitals filed for this visit.  Subjective Assessment - 08/15/18 1155    Subjective  Pt last seen 12/9. She has been sick and out of town. She states that she is doing somewhat better, with less pain, and is doing HEP. She states L LE pain is improving, movement is improving, but still has pain in R SI region that is bothersome. She is also sleeping better.     Currently in Pain?  Yes    Pain Score  3     Pain Location  Back    Pain Orientation  Right    Pain Descriptors / Indicators  Aching;Tightness    Pain Type  Chronic pain    Pain Onset  More than a month ago    Pain Frequency  Intermittent                        [facility] Adult PT Treatment/Exercise - 08/15/18 1100      Exercises   Exercises  Lumbar      Lumbar Exercises: Stretches   Active Hamstring Stretch  3 reps;30 seconds    Double Knee to Chest Stretch  --    ITB Stretch  3 reps;30 seconds    ITB Stretch Limitations  strap    Other Lumbar Stretch Exercise  childs pose 30 sec x3;       Lumbar Exercises: Aerobic   Stationary Bike  L2 x 9 min      Lumbar Exercises: Standing   Other Standing Lumbar Exercises  Hip abd and ext 2x10 bil; March x20; Walk/March Fwd/Bwd 15 ft x4;       Lumbar Exercises: Seated   Other Seated Lumbar Exercises  Slump x15 bil;       Lumbar Exercises: Supine   Bent Knee Raise  20 reps    Bridge  --    Straight Leg Raise  20  reps      Lumbar Exercises: Sidelying   Hip Abduction  --      Lumbar Exercises: Prone   Other Prone Lumbar Exercises  --      Manual Therapy   Manual Therapy  Soft tissue mobilization;Passive ROM;Manual Traction;Joint mobilization    Soft tissue mobilization  DTM to bil Lumbar paraspinals on R;             PT Education - 08/15/18 1158    Education Details  Final HEP reviewed,     Person(s) Educated  Patient    Methods  Explanation;Demonstration;Handout;Verbal cues;Tactile cues    Comprehension  Verbalized understanding       PT Short Term Goals - 08/15/18 1159      PT SHORT TERM GOAL #1   Title  Pt to report decreased pain in L LE and lumbar region, to 5/10 with activity     Time  3    Period  Weeks    Status  Achieved      PT SHORT TERM GOAL #2   Title  Pt to be independent with initial HEP     Time  2    Period  Weeks    Status  Achieved        PT Long Term Goals - 08/15/18 1159      PT LONG TERM GOAL #1   Title  Pt to be independent with final HEp for lumbar spine     Time  6    Period  Weeks    Status  Achieved      PT LONG TERM GOAL #2   Title  Pt to demo improved ROM for lumar spine, to be WNL for pt age    Time   6    Period  Weeks    Status  Achieved      PT LONG TERM GOAL #3   Title  Pt to report decreased pain in L LE and lumbar region, to 0-3/10 with activity, to improve ablility for IADLs and ADLS.     Time  6    Period  Weeks    Status  Achieved      PT LONG TERM GOAL #4   Title  Pt to report increased abiity for standing/walking, for up to 1 mile, with pain <3/10, to improve ability for community navigation.     Time  6    Period  Weeks    Status  Achieved            Plan - 08/15/18 1200    Clinical Impression Statement  Pt has made good improvements overall, with improved mobility and much improved pain. She has good understanding of Final HEP, and has been diligent with performing, which is giving her relief. She continues to have tightness and hypertonicity of posterior chain, and will benefit from continued focus on mobiliy. Progress has been variable, due to pt being easily aggravated by activity, but Pt has met goals at this time, and is ready for d/c to HEP. Pt in agreement with plan.     Rehab Potential  Good    PT Frequency  1x / week    PT Duration  4 weeks    PT Treatment/Interventions  ADLs/Self Care Home Management;Cryotherapy;Electrical Stimulation;Iontophoresis 31m/ml Dexamethasone;Functional mobility training;Stair training;Gait training;Ultrasound;Moist Heat;Therapeutic activities;Therapeutic exercise;Balance training;Neuromuscular re-education;Patient/family education;Passive range of motion;Manual techniques;Dry needling;Taping    Consulted and Agree with Plan of Care  Patient  Patient will benefit from skilled therapeutic intervention in order to improve the following deficits and impairments:  Abnormal gait, Decreased range of motion, Increased muscle spasms, Decreased activity tolerance, Pain, Improper body mechanics, Impaired flexibility, Hypomobility, Decreased mobility, Decreased strength, Difficulty walking  Visit Diagnosis: Chronic bilateral low back  pain with left-sided sciatica     Problem List Patient Active Problem List   Diagnosis Date Noted  . Insomnia 07/13/2018  . Anxiety 06/09/2018  . Chronic kidney disease due to systemic infection ([place]) 06/09/2018  . Chronic pain 06/09/2018  . Degenerative disc disease, lumbar 06/09/2018  . May-Thurner syndrome 06/09/2018  . Radiculopathy 06/09/2018  . Spinal stenosis 06/09/2018  . Tachycardia 06/09/2018  . Failed back syndrome of lumbar spine 04/13/2018  . Urethral spasm   . Chronic back pain   . POTS (postural orthostatic tachycardia syndrome)   . Inappropriate sinus tachycardia   . Frequent UTI   . History of DVT (deep vein thrombosis) 05/17/2013    Lyndee Hensen, PT, DPT  12:05 PM  08/15/18    [facility] [place], [place], [address] Phone: [phone]   Fax:  [phone]  Name: Christie Hurst MRN: [id number] Date of Birth: 01/13/1976     PHYSICAL THERAPY DISCHARGE SUMMARY  Visits from Start of Care:     10  Plan: Patient agrees to discharge.  Patient goals were met. Patient is being discharged due to meeting the stated rehab goals.  ?????       Lyndee Hensen, PT, DPT 12:06 PM  08/15/18

## 2018-08-15 NOTE — Patient Instructions (Signed)
Access Code: [email or web address]  URL: [email or web address]  Date: 08/15/2018  Prepared by: Sedalia MutaLauren [name] Hafford   Exercises  Child's Pose Stretch - 3 reps - 30 hold - 2x daily  TL Sidebending Stretch - Single Arm Overhead - 3 reps - 30 hold - 2x daily  Seated Hamstring Stretch - 3 reps - 30 hold - 2x daily  Seated Slump Neural Tensioner - 10 reps - 2 sets - 1x daily  Thomas Stretch on Table - 3 reps - 30 hold - 2x daily  Supine ITB Stretch with Strap - 3 reps - 30 hold - 2x daily  Kneeling Hip Flexor Stretch - 3 reps - 30 hold - 2x daily  Standing Hip Abduction - 10 reps - 2 sets - 1x daily  Standing Hip Extension - 10 reps - 2 sets - 1x daily  Standing March - 10 reps - 2 sets - 1x daily  Sidelying Hip Abduction - 10 reps - 2 sets - 1x daily  Supine March - 10 reps - 2 sets - 2x daily  Straight Leg Raise - 10 reps - 2 sets - 1x daily

## 2018-09-03 NOTE — Progress Notes (Signed)
BH MD/PA/NP OP Progress Note  09/05/2018 1:47 PM Christie Hurst  MRN:  [id number]  Chief Complaint:  Chief Complaint    Anxiety; Follow-up     HPI:  Patient presents for follow-up appointment for insomnia and anxiety.  She states that she had insomnia last night due to severe pain.  She tends to feel anxious at times when she thinks about financial strain.  She is concerned that she may not be able to have a job due to her pain.  However, she believes that Belbuca helps her pain significantly given that she has time when she has less pain.  Although she sees improvement in pain, she does not think her husband or her family member does not see it.  She visited her parents in law last year; her husband agreed with the patient that they would not visit there to avoid "chaos." She states that her dog was bitten there as well. [place] She states that her husband says it is "okay" until he actually sees her in pain. She is concerned that she may need to use scooter due to her pain.  She has insomnia few times a week.  She denies feeling depressed.  She denies SI.  She feels anxious and tense at times.  She denies panic attacks.  She has strong preference to stay on current medication regimen, referring to her previous experience/side effect with other medication.   Temazepam, clonazepam filled on 08/13/2018. On hydromorphone, belbuca  Visit Diagnosis:    ICD-10-CM   1. Insomnia, unspecified type G47.00     Past Psychiatric History: Please see initial evaluation for full details. I have reviewed the history. No updates at this time.     Past Medical History:  Past Medical History:  Diagnosis Date  . Chronic back pain   . Frequent UTI    d/t congential abnormality   . History of DVT (deep vein thrombosis) 05/2013  . Inappropriate sinus tachycardia   . Phlebitis   . POTS (postural orthostatic tachycardia syndrome)   . Tachycardia   . Urethral  spasm   . UTI (urinary tract infection)     Past Surgical History:  Procedure Laterality Date  . CESAREAN SECTION     x 2   . LAMINECTOMY  12/2012   L5/S1  . microdiscetomy  11/2012   L5/S1  . SPINAL FUSION  03/2013   L5/S1  . stent/DVT  05/2013   Left groin    Family Psychiatric History: Please see initial evaluation for full details. I have reviewed the history. No updates at this time.   Family History:  Family History  Problem Relation Age of Onset  . Early death Mother [id number]  . Transient ischemic attack Mother   . Depression Mother   . Mental illness Mother   . Transient ischemic attack Father   . Breast cancer Paternal Grandmother   . Breast cancer Paternal Aunt   . Breast cancer Paternal Aunt     Social History:  Social History   Socioeconomic History  . Marital status: Married    Spouse name: jamie  . Number of children: 2  . Years of education: Not on file  . Highest education level: Bachelor's degree (e.g., BA, AB, BS)  Occupational History  . Not on file  Social Needs  . Financial resource strain: Not hard at all  . Food insecurity:    Worry: Never true    Inability:  Never true  . Transportation needs:    Medical: No    Non-medical: No  Tobacco Use  . Smoking status: Never Smoker  . Smokeless tobacco: Never Used  Substance and Sexual Activity  . Alcohol use: Never    Frequency: Never  . Drug use: Never  . Sexual activity: Yes  Lifestyle  . Physical activity:    Days per week: 0 days    Minutes per session: 0 min  . Stress: Very much  Relationships  . Social connections:    Talks on phone: Not on file    Gets together: Not on file    Attends religious service: Never    Active member of club or organization: No    Attends meetings of clubs or organizations: Never    Relationship status: Married  Other Topics Concern  . 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 Tamsulosin Hypertension, Photosensitivity, Rash and Shortness Of Breath  . Tramadol Anxiety, Palpitations and Shortness Of Breath  . Cephalexin Hives  . Duloxetine Hcl Other (See Comments)  . Lamotrigine Photosensitivity  . Remeron [Mirtazapine]     Chest pain  . Seroquel [Quetiapine Fumarate]     "out of it" it opposite of what it was suppose to do  . Sulfamethoxazole-Trimethoprim Hives and Palpitations    Metabolic Disorder Labs: No results found for: HGBA1C, MPG No results found for: PROLACTIN No results found for: CHOL, TRIG, HDL, CHOLHDL, VLDL, LDLCALC No results found for: TSH  Therapeutic Level Labs: No results found for: LITHIUM No results found for: VALPROATE No components found for:  CBMZ  Current Medications: Current Outpatient Medications  Medication Sig Dispense Refill  . amitriptyline (ELAVIL) 25 MG tablet Take 25 mg by mouth at bedtime.    . Buprenorphine HCl (BELBUCA) 150 MCG FILM Place 300 mcg inside cheek daily.     Melene Muller ON 09/09/2018] clonazePAM (KLONOPIN) 0.5 MG tablet Take 1 tablet (0.5 mg total) by mouth daily as needed for anxiety. 30 tablet 2  . HYDROmorphone (DILAUDID) 2 MG tablet Take 1 tablet (2 mg total) by mouth every 6 (six) hours as needed for severe pain. 20 tablet 0  . nitrofurantoin (MACRODANTIN) 100 MG capsule Take 1 capsule (100 mg total) by mouth daily. 90 capsule 3  . olopatadine (PATANOL) 0.1 % ophthalmic solution Place 1 drop into both eyes daily. 5 mL 3  . [START ON 09/09/2018] temazepam (RESTORIL) 15 MG capsule Take 1 capsule (15 mg total) by mouth at bedtime as needed for sleep. 30 capsule 2  . acebutolol (SECTRAL) 200 MG capsule Take 1 capsule (200 mg total) by mouth daily. 90 capsule 1   No current facility-administered medications for this visit.      Musculoskeletal: Strength & Muscle Tone: within normal limits Gait & Station: normal Patient leans: N/A  Psychiatric  Specialty Exam: Review of Systems  Psychiatric/Behavioral: Negative for depression, hallucinations, memory loss, substance abuse and suicidal ideas. The patient is nervous/anxious and has insomnia.   All other systems reviewed and are negative.   Blood pressure 107/65, pulse 79, height 5\' 4"  (1.626 m), weight 125 lb (56.7 kg), SpO2 98 %.Body mass index is 21.46 kg/m.  General Appearance: Fairly Groomed  Eye Contact:  Good  Speech:  Clear and Coherent  Volume:  Normal  Mood:  "  fine"  Affect:  Appropriate and Constricted  Thought Process:  Coherent  Orientation:  Full (Time, Place, and Person)  Thought Content: Logical   Suicidal Thoughts:  No  Homicidal Thoughts:  No  Memory:  Immediate;   Good  Judgement:  Good  Insight:  Good  Psychomotor Activity:  Normal  Concentration:  Concentration: Good and Attention Span: Good  Recall:  Good  Fund of Knowledge: Good  Language: Good  Akathisia:  No  Handed:  Right  AIMS (if indicated): not done  Assets:  Communication Skills Desire for Improvement  ADL's:  Intact  Cognition: WNL  Sleep:  Poor   Screenings:   Assessment and Plan:  Christie Hurst is a 43 y.o. year old female with a history of insomnia, chronic pain syndrome,  multiple back surgeries with complications, urethral spasm, congenital anomaly of the ureter, inappropriate tachycardia,, who presents for follow up appointment for Insomnia, unspecified type  # Insomnia There has been overall improvement in insomnia, which she attributes to side effect from Belbuca and pain.  Noted that patient has had adverse reaction to several medication for insomnia.  Although it is preferable not to use benzodiazepine for insomnia, especially with concomitant use of buprenorphine, will continue clonazepam and temazepam for insomnia given patient reports significant benefit from this medication.  Discussed potential side effects of respiratory suppression.   # r/o adjustment disorder with  anxiety Patient reports occasional anxiety in the context of conflict with her family in law.  She also has other psychosocial stressors, which includes pain and unemployment.  Discussed self compassion.   Plan 1. Continue clonazepam 0.5 mg daily as needed for anxiety 2. Continue temazepam 15 mg at night as needed for anxiety 3. Return to clinic in three months for 30 mins - on buprenorphine, amitriptyline 25 mg at night (limited benefit from higher dose) - explore developmental history at the next visit  Past trials of medication: citalopram (jaw stiffness), sertraline (restless), fluoxetine (restless), duloxetine, mirtazapine (chest pain), Quetiapine- "sick," Baclofen- crazy dreams, hydroxyzine (hyperactive), gabapentin (drowsiness), melatonin, Ambien (middle insomnia),  trazodone, temazepam, clonazepam,   The patient demonstrates the following risk factors for suicide: Chronic risk factors for suicide include: psychiatric disorder of anxiety and chronic pain. Acute risk factors for suicide include: family or marital conflict and unemployment. Protective factors for this patient include: positive social support, responsibility to others (children, family), coping skills and hope for the future. Considering these factors, the overall suicide risk at this point appears to be low. Patient is appropriate for outpatient follow up.  The duration of this appointment visit was 30 minutes of face-to-face time with the patient.  Greater than 50% of this time was spent in counseling, explanation of  diagnosis, planning of further management, and coordination of care.  Neysa Hottereina Loreley Schwall, MD 09/05/2018, 1:47 PM

## 2018-09-05 ENCOUNTER — Encounter (HOSPITAL_COMMUNITY): Payer: Self-pay | Admitting: Psychiatry

## 2018-09-05 ENCOUNTER — Ambulatory Visit (HOSPITAL_COMMUNITY): Payer: 59 | Admitting: Psychiatry

## 2018-09-05 VITALS — BP 107/65 | HR 79 | Ht 64.0 in | Wt 125.0 lb

## 2018-09-05 DIAGNOSIS — G4700 Insomnia, unspecified: Secondary | ICD-10-CM

## 2018-09-05 MED ORDER — CLONAZEPAM 0.5 MG PO TABS: 0.5000 mg | ORAL_TABLET | Freq: Every day | ORAL | 2 refills | Status: DC | PRN

## 2018-09-05 MED ORDER — TEMAZEPAM 15 MG PO CAPS: 15.0000 mg | ORAL_CAPSULE | Freq: Every evening | ORAL | 2 refills | Status: DC | PRN

## 2018-09-05 NOTE — Patient Instructions (Addendum)
1. Continue clonazepam 0.5 mg daily as needed for anxiety 2. Continue temazepam 15 mg at night as needed for anxiety 3. Return to clinic in three months for 30 mins

## 2018-09-12 ENCOUNTER — Ambulatory Visit (HOSPITAL_COMMUNITY): Payer: Self-pay | Admitting: Psychiatry

## 2018-09-22 ENCOUNTER — Encounter: Payer: Self-pay | Admitting: Sports Medicine

## 2018-10-04 DIAGNOSIS — G894 Chronic pain syndrome: Secondary | ICD-10-CM | POA: Diagnosis not present

## 2018-10-04 DIAGNOSIS — M961 Postlaminectomy syndrome, not elsewhere classified: Secondary | ICD-10-CM | POA: Diagnosis not present

## 2018-10-04 DIAGNOSIS — Z79899 Other long term (current) drug therapy: Secondary | ICD-10-CM | POA: Diagnosis not present

## 2018-10-28 ENCOUNTER — Other Ambulatory Visit: Payer: Self-pay | Admitting: Adult Health

## 2018-10-31 ENCOUNTER — Encounter: Payer: Self-pay | Admitting: Adult Health

## 2018-10-31 NOTE — Telephone Encounter (Signed)
Another request has been sent to [facility]: Lambert Rhonda W for review.

## 2018-11-01 ENCOUNTER — Other Ambulatory Visit: Payer: Self-pay | Admitting: Adult Health

## 2018-11-01 MED ORDER — AMITRIPTYLINE HCL 25 MG PO TABS: 25.0000 mg | ORAL_TABLET | Freq: Every day | ORAL | 1 refills | Status: DC

## 2018-11-03 DIAGNOSIS — M961 Postlaminectomy syndrome, not elsewhere classified: Secondary | ICD-10-CM | POA: Diagnosis not present

## 2018-11-03 DIAGNOSIS — G894 Chronic pain syndrome: Secondary | ICD-10-CM | POA: Diagnosis not present

## 2018-11-08 DIAGNOSIS — M961 Postlaminectomy syndrome, not elsewhere classified: Secondary | ICD-10-CM | POA: Diagnosis not present

## 2018-11-08 DIAGNOSIS — G894 Chronic pain syndrome: Secondary | ICD-10-CM | POA: Diagnosis not present

## 2018-11-24 NOTE — Progress Notes (Signed)
Virtual Visit via Video Note  I connected with Christie Hurst on 12/05/18 at 10:30 AM EDT by a video enabled telemedicine application and verified that I am speaking with the correct person using two identifiers.   I discussed the limitations of evaluation and management by telemedicine and the availability of in person appointments. The patient expressed understanding and agreed to proceed.   I discussed the assessment and treatment plan with the patient. The patient was provided an opportunity to ask questions and all were answered. The patient agreed with the plan and demonstrated an understanding of the instructions.   The patient was advised to call back or seek an in-person evaluation if the symptoms worsen or if the condition fails to improve as anticipated.  I provided 25 minutes of non-face-to-face time during this encounter.   [name], MD    [facility] MD/PA/NP OP Progress Note  12/05/2018 11:03 AM Christie Hurst  MRN:  [id number]  Chief Complaint:  Chief Complaint    Follow-up; Other     HPI:  This is a follow-up visit for insomnia.  She states that she has been doing much better after her pain medication was changed to levorphanol.  She has been able to be more active.  She purchased a house and moved in on March.  She now has a garden and enjoys working on yard work.  She reports good relationship with her husband and her children.  She has been able to ride on an elliptic  Bike for 20 mins, which is significant improvement compared to before.  She has been able to do more house chores.  She finally feels almost back to herself after 6 years.  Although she reports memory loss  "not sharp" compared to before, she believes it has been improving.  She sleeps well; she takes Klonopin and temazepam every day for insomnia.  She had insomnia when she did not take those medication.  She denies feeling depressed.  She feels less anxious.  She denies irritability.  She denies panic  attacks.  She denies SI.   Visit Diagnosis:    ICD-10-CM   1. Insomnia, unspecified type G47.00     Past Psychiatric History: Please see initial evaluation for full details. I have reviewed the history. No updates at this time.     Past Medical History:  Past Medical History:  Diagnosis Date  . Chronic back pain   . Frequent UTI    d/t congential abnormality   . History of DVT (deep vein thrombosis) 05/2013  . Inappropriate sinus tachycardia   . Phlebitis   . POTS (postural orthostatic tachycardia syndrome)   . Tachycardia   . Urethral spasm   . UTI (urinary tract infection)     Past Surgical History:  Procedure Laterality Date  . CESAREAN SECTION     x 2   . LAMINECTOMY  12/2012   L5/S1  . microdiscetomy  11/2012   L5/S1  . SPINAL FUSION  03/2013   L5/S1  . stent/DVT  05/2013   Left groin    Family Psychiatric History: Please see initial evaluation for full details. I have reviewed the history. No updates at this time.     Family History:  Family History  Problem Relation Age of Onset  . Early death Mother 48  . Transient ischemic attack Mother   . Depression Mother   . Mental illness Mother   . Transient ischemic attack Father   . Breast cancer Paternal Grandmother   .  Breast cancer Paternal Aunt   . Breast cancer Paternal Aunt     Social History:  Social History   Socioeconomic History  . Marital status: Married    Spouse name: jamie  . Number of children: 2  . Years of education: Not on file  . Highest education level: Bachelor's degree (e.g., BA, AB, BS)  Occupational History  . Not on file  Social Needs  . Financial resource strain: Not hard at all  . Food insecurity:    Worry: Never true    Inability: Never true  . Transportation needs:    Medical: No    Non-medical: No  Tobacco Use  . Smoking status: Never Smoker  . Smokeless tobacco: Never Used  Substance and Sexual Activity  . Alcohol use: Never    Frequency: Never  . Drug  use: Never  . Sexual activity: Yes  Lifestyle  . Physical activity:    Days per week: 0 days    Minutes per session: 0 min  . Stress: Very much  Relationships  . Social connections:    Talks on phone: Not on file    Gets together: Not on file    Attends religious service: Never    Active member of club or organization: No    Attends meetings of clubs or organizations: Never    Relationship status: Married  Other Topics Concern  . 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 Tamsulosin Hypertension, Photosensitivity, Rash and Shortness Of Breath  . Tramadol Anxiety, Palpitations and Shortness Of Breath  . Cephalexin Hives  . Duloxetine Hcl Other (See Comments)  . Lamotrigine Photosensitivity  . Remeron [Mirtazapine]     Chest pain  . Seroquel [Quetiapine Fumarate]     "out of it" it opposite of what it was suppose to do  . Sulfamethoxazole-Trimethoprim Hives and Palpitations    Metabolic Disorder Labs: No results found for: HGBA1C, MPG No results found for: PROLACTIN No results found for: CHOL, TRIG, HDL, CHOLHDL, VLDL, LDLCALC No results found for: TSH  Therapeutic Level Labs: No results found for: LITHIUM No results found for: VALPROATE No components found for:  CBMZ  Current Medications: Current Outpatient Medications  Medication Sig Dispense Refill  . HYDROmorphone (DILAUDID) 2 MG tablet Take 1 tablet (2 mg total) by mouth every 6 (six) hours as needed for severe pain. 20 tablet 0  . levorphanol (LEVODROMORAN) 2 MG tablet Take 2 mg by mouth every 6 (six) hours as needed for pain.    Melene Muller ON 12/12/2018] temazepam (RESTORIL) 15 MG capsule Take 1 capsule (15 mg total) by mouth at bedtime as needed for sleep. 30 capsule 2  . acebutolol (SECTRAL) 200 MG capsule Take 1 capsule (200 mg total) by mouth daily. 90 capsule 1  . amitriptyline (ELAVIL) 25  MG tablet Take 1 tablet (25 mg total) by mouth at bedtime. 90 tablet 1  . Buprenorphine HCl (BELBUCA) 150 MCG FILM Place 300 mcg inside cheek daily.     Melene Muller ON 12/12/2018] clonazePAM (KLONOPIN) 0.5 MG tablet Take 1 tablet (0.5 mg total) by mouth daily as needed for anxiety. 30 tablet 2  . nitrofurantoin (MACRODANTIN) 100 MG capsule Take 1 capsule (100 mg total) by mouth daily. 90 capsule 3  . olopatadine (PATANOL) 0.1 % ophthalmic solution Place 1  drop into both eyes daily. 5 mL 3   No current facility-administered medications for this visit.      Musculoskeletal: Strength & Muscle Tone: N/A Gait & Station: N/A Patient leans: N/A  Psychiatric Specialty Exam: Review of Systems  Psychiatric/Behavioral: Positive for memory loss. Negative for depression, hallucinations, substance abuse and suicidal ideas. The patient has insomnia. The patient is not nervous/anxious.   All other systems reviewed and are negative.   There were no vitals taken for this visit.There is no height or weight on file to calculate BMI.  General Appearance: Fairly Groomed  Eye Contact:  Good  Speech:  Clear and Coherent  Volume:  Normal  Mood:  "good"  Affect:  Appropriate, Congruent and Full Range  Thought Process:  Coherent  Orientation:  Full (Time, Place, and Person)  Thought Content: Logical   Suicidal Thoughts:  No  Homicidal Thoughts:  No  Memory:  Immediate;   Good  Judgement:  Good  Insight:  Good  Psychomotor Activity:  Normal  Concentration:  Concentration: Good and Attention Span: Good  Recall:  Good  Fund of Knowledge: Good  Language: Good  Akathisia:  No  Handed:  Right  AIMS (if indicated): not done  Assets:  Communication Skills Desire for Improvement  ADL's:  Intact  Cognition: WNL  Sleep:  Good on temazepam, clonazepam   Screenings:   Assessment and Plan:  Christie Hurst is a 43 y.o. year old female with a history of insomnia, chronic pain syndrome, multiple back surgeries  with complications,urethral spasm,congenital anomaly of the ureter, inappropriate tachycardia , who presents for follow up appointment for Insomnia, unspecified type  # Insomnia There has been significant improvement in insomnia after she switched her medication regimen for pain.  Will continue clonazepam and temazepam for insomnia.  Although it is preferable not to use benzodiazepine for insomnia, especially with concomitant use of opioid, will continue this medication at this time given benefits outweigh risks. She has had adverse reaction to trials of hypnotics.  Discussed potential risk of respiratory suppression and dependence.  She is very receptive to try tapering down clonazepam if able.  Discussed sleep hygiene.   # r/o adjustment disorder with anxiety There has been significant improvement in anxiety and depressive symptoms in the context of improvement in pain.  Psychosocial stressors includes unemployment.  Discussed behavioral activation.   Plan 1. Continue clonazepam 0.5 mg daily as needed for insomnia 2. Continue temazepam 15 mg at night as needed for insomnia 3. Next appointment 7/13 at 10 AM for 15 mins - on buprenorphine, amitriptyline 25 mg at night(limited benefit from higher dose) - explore developmental history at the next visit  Past trials of medication:citalopram (jaw stiffness), sertraline (restless), fluoxetine (restless), duloxetine, mirtazapine (chest pain), Quetiapine- "sick," Baclofen- crazy dreams, hydroxyzine (hyperactive), gabapentin (drowsiness), melatonin,Ambien (middle insomnia),trazodone, temazepam, clonazepam,   The patient demonstrates the following risk factors for suicide: Chronic risk factors for suicide include:psychiatric disorder ofanxietyand chronic pain. Acute risk factorsfor suicide include: family or marital conflict and unemployment. Protective factorsfor this patient include: positive social support, responsibility to others  (children, family), coping skills and hope for the future. Considering these factors, the overall suicide risk at this point appears to below. Patientisappropriate for outpatient follow up.   Neysa Hottereina Ilham Roughton, MD 12/05/2018, 11:03 AM

## 2018-12-05 ENCOUNTER — Ambulatory Visit (INDEPENDENT_AMBULATORY_CARE_PROVIDER_SITE_OTHER): Payer: 59 | Admitting: Psychiatry

## 2018-12-05 ENCOUNTER — Other Ambulatory Visit: Payer: Self-pay

## 2018-12-05 ENCOUNTER — Encounter (HOSPITAL_COMMUNITY): Payer: Self-pay | Admitting: Psychiatry

## 2018-12-05 DIAGNOSIS — G4700 Insomnia, unspecified: Secondary | ICD-10-CM | POA: Diagnosis not present

## 2018-12-05 MED ORDER — TEMAZEPAM 15 MG PO CAPS: 15.0000 mg | ORAL_CAPSULE | Freq: Every evening | ORAL | 2 refills | Status: DC | PRN

## 2018-12-05 MED ORDER — CLONAZEPAM 0.5 MG PO TABS: 0.5000 mg | ORAL_TABLET | Freq: Every day | ORAL | 2 refills | Status: DC | PRN

## 2018-12-07 DIAGNOSIS — Z79899 Other long term (current) drug therapy: Secondary | ICD-10-CM | POA: Diagnosis not present

## 2018-12-07 DIAGNOSIS — G894 Chronic pain syndrome: Secondary | ICD-10-CM | POA: Diagnosis not present

## 2018-12-07 DIAGNOSIS — M961 Postlaminectomy syndrome, not elsewhere classified: Secondary | ICD-10-CM | POA: Diagnosis not present

## 2019-01-25 ENCOUNTER — Other Ambulatory Visit: Payer: Self-pay | Admitting: Adult Health

## 2019-01-25 DIAGNOSIS — R000 Tachycardia, unspecified: Secondary | ICD-10-CM

## 2019-02-20 NOTE — Progress Notes (Signed)
Virtual Visit via Video Note  I connected with Christie Hurst on 02/27/19 at 10:00 AM EDT by a video enabled telemedicine application and verified that I am speaking with the correct person using two identifiers.   I discussed the limitations of evaluation and management by telemedicine and the availability of in person appointments. The patient expressed understanding and agreed to proceed.      I discussed the assessment and treatment plan with the patient. The patient was provided an opportunity to ask questions and all were answered. The patient agreed with the plan and demonstrated an understanding of the instructions.   The patient was advised to call back or seek an in-person evaluation if the symptoms worsen or if the condition fails to improve as anticipated.  I provided 15 minutes of non-face-to-face time during this encounter.   Christie Hottereina Pierce Biagini, MD    [facility]/NP OP Progress Note  02/27/2019 12:58 PM Christie Hurst  MRN:  [id number]  Chief Complaint:  Chief Complaint    Follow-up; Anxiety; Depression     HPI:  This is a follow-up appointment for insomnia.  She states that she has not been doing.  Although she has tried to start the job in graduate school, it has been challenging to the patient.  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  She has been trying to work half day.  Although she was accepted to graduate school, she has been having difficulty with organizing and concentration.  She finished assignments 4 weeks later after the due date.  She agrees to take small steps to get adjust to things. There has been no change in her pain medication, although she discussed the situation with her provider. She has middle insomnia due to pain. She feels depressed due to the situation. She has fair appetite. She denies SI. She feels anxious, tense. She denies panic attacks.   Visit Diagnosis:    ICD-10-CM   1.  Insomnia, unspecified type  G47.00   2. Adjustment disorder with mixed anxiety and depressed mood  F43.23     Past Psychiatric History: Please see initial evaluation for full details. I have reviewed the history. No updates at this time.     Past Medical History:  Past Medical History:  Diagnosis Date  . Chronic back pain   . Frequent UTI    d/t congential abnormality   . History of DVT (deep vein thrombosis) 05/2013  . Inappropriate sinus tachycardia   . Phlebitis   . POTS (postural orthostatic tachycardia syndrome)   . Tachycardia   . Urethral spasm   . UTI (urinary tract infection)     Past Surgical History:  Procedure Laterality Date  . CESAREAN SECTION     x 2   . LAMINECTOMY  12/2012   L5/S1  . microdiscetomy  11/2012   L5/S1  . SPINAL FUSION  03/2013   L5/S1  . stent/DVT  05/2013   Left groin    Family Psychiatric History: Please see initial evaluation for full details. I have reviewed the history. No updates at this time.     Family History:  Family History  Problem Relation Age of Onset  . Early death Mother [id number]  . Transient ischemic attack Mother   . Depression Mother   . Mental illness Mother   . Transient ischemic attack Father   . Breast cancer Paternal Grandmother   . Breast cancer Paternal Aunt   .  Breast cancer Paternal Aunt     Social History:  Social History   Socioeconomic History  . Marital status: Married    Spouse name: jamie  . Number of children: 2  . Years of education: Not on file  . Highest education level: Bachelor's degree (e.g., BA, AB, BS)  Occupational History  . Not on file  Social Needs  . Financial resource strain: Not hard at all  . Food insecurity    Worry: Never true    Inability: Never true  . Transportation needs    Medical: No    Non-medical: No  Tobacco Use  . Smoking status: Never Smoker  . Smokeless tobacco: Never Used  Substance and Sexual Activity  . Alcohol use: Never    Frequency: Never  .  Drug use: Never  . Sexual activity: Yes  Lifestyle  . Physical activity    Days per week: 0 days    Minutes per session: 0 min  . Stress: Very much  Relationships  . Social Herbalist on phone: Not on file    Gets together: Not on file    Attends religious service: Never    Active member of club or organization: No    Attends meetings of clubs or organizations: Never    Relationship status: Married  Other Topics Concern  . 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 Tamsulosin Hypertension, Photosensitivity, Rash and Shortness Of Breath  . Tramadol Anxiety, Palpitations and Shortness Of Breath  . Cephalexin Hives  . Duloxetine Hcl Other (See Comments)  . Lamotrigine Photosensitivity  . Remeron [Mirtazapine]     Chest pain  . Seroquel [Quetiapine Fumarate]     "out of it" it opposite of what it was suppose to do  . Sulfamethoxazole-Trimethoprim Hives and Palpitations    Metabolic Disorder Labs: No results found for: HGBA1C, MPG No results found for: PROLACTIN No results found for: CHOL, TRIG, HDL, CHOLHDL, VLDL, LDLCALC No results found for: TSH  Therapeutic Level Labs: No results found for: LITHIUM No results found for: VALPROATE No components found for:  CBMZ  Current Medications: Current Outpatient Medications  Medication Sig Dispense Refill  . acebutolol (SECTRAL) 200 MG capsule TAKE 1 CAPSULE(200 MG) BY MOUTH DAILY 90 capsule 1  . amitriptyline (ELAVIL) 25 MG tablet Take 1 tablet (25 mg total) by mouth at bedtime. 90 tablet 1  . Buprenorphine HCl (BELBUCA) 150 MCG FILM Place 300 mcg inside cheek daily.     Derrill Memo ON 03/15/2019] clonazePAM (KLONOPIN) 0.5 MG tablet Take 1 tablet (0.5 mg total) by mouth daily as needed for anxiety. 30 tablet 0  . HYDROmorphone (DILAUDID) 2 MG tablet Take 1 tablet (2 mg total) by mouth every 6 (six) hours  as needed for severe pain. 20 tablet 0  . levorphanol (LEVODROMORAN) 2 MG tablet Take 2 mg by mouth every 6 (six) hours as needed for pain.    . nitrofurantoin (MACRODANTIN) 100 MG capsule Take 1 capsule (100 mg total) by mouth daily. 90 capsule 3  . olopatadine (PATANOL) 0.1 % ophthalmic solution Place 1 drop into both eyes daily. 5 mL 3  . [START ON 03/15/2019] temazepam (RESTORIL) 15 MG capsule Take 1 capsule (15 mg total) by mouth at bedtime as needed for sleep. 30 capsule 0  No current facility-administered medications for this visit.      Musculoskeletal: Strength & Muscle Tone: N/A Gait & Station: N/A Patient leans: N/A  Psychiatric Specialty Exam: Review of Systems  Musculoskeletal: Positive for back pain.  Psychiatric/Behavioral: Positive for depression. Negative for hallucinations, memory loss, substance abuse and suicidal ideas. The patient is nervous/anxious and has insomnia.   All other systems reviewed and are negative.   There were no vitals taken for this visit.There is no height or weight on file to calculate BMI.  General Appearance: Fairly Groomed  Eye Contact:  Good  Speech:  Clear and Coherent  Volume:  Normal  Mood:  Anxious and Depressed  Affect:  Appropriate, Congruent and Restricted  Thought Process:  Coherent  Orientation:  Full (Time, Place, and Person)  Thought Content: Logical   Suicidal Thoughts:  No  Homicidal Thoughts:  No  Memory:  Immediate;   Good  Judgement:  Good  Insight:  Good  Psychomotor Activity:  Normal  Concentration:  Concentration: Good and Attention Span: Good  Recall:  Good  Fund of Knowledge: Good  Language: Good  Akathisia:  No  Handed:  Right  AIMS (if indicated): not done  Assets:  Communication Skills Desire for Improvement  ADL's:  Intact  Cognition: WNL  Sleep:  Poor   Screenings:   Assessment and Plan:  Christie Hurst is a 43 y.o. year old female with a history of insomnia,  chronic pain syndrome,multiple  back surgeries with complications,urethral spasm,congenital anomaly of the ureter, inappropriate tachycardia, who presents for follow up appointment for insomnia.   # Insomnia She reports slight worsening in insomnia in the context of chronic pain.  Will continue clonazepam and temazepam for insomnia.  Although it is preferable not to use benzodiazepine for insomnia, especially with concomitant use of opioid, will continue this medication at this time given benefits outweighs risk.  Discussed risk of dependence and oversedation.  She has had adverse reaction to trials of hypnotics as described below.  She has been very receptive to try tapering down clonazepam if able. Discussed sleep hygiene.   # r/o adjustment disorder with anxiety and depressed mood.  She reports slight worsening in anxiety and depression in the context of pain, and demoralization.  Although she will greatly benefit from ACT, she is not interested in this option at this time. Will continue to monitor.   Plan I have reviewed and updated plans as below 1. Continue clonazepam 0.5 mg daily as needed for insomnia 2. Continue temazepam 15 mg at night as needed for insomnia 3. Next appointment 8/24 at 9:20 for 30 mins, video - on buprenorphine, amitriptyline 25 mg at night(limited benefit from higher dose; prescribed by urology) - explore developmental history at the next visit  Past trials of medication:citalopram (jaw stiffness), sertraline (restless), fluoxetine (restless),duloxetine,mirtazapine (chest pain),Quetiapine- "sick,"Baclofen- crazy dreams,hydroxyzine (hyperactive),gabapentin (drowsiness),melatonin,Ambien (middle insomnia),trazodone, temazepam, clonazepam,  The patient demonstrates the following risk factors for suicide: Chronic risk factors for suicide include:psychiatric disorder ofanxietyand chronic pain. Acute risk factorsfor suicide include: family or marital conflict and unemployment.  Protective factorsfor this patient include: positive social support, responsibility to others (children, family), coping skills and hope for the future. Considering these factors, the overall suicide risk at this point appears to below. Patientisappropriate for outpatient follow up.  Christie Hottereina Delana Manganello, MD 02/27/2019, 12:58 PM

## 2019-02-27 ENCOUNTER — Other Ambulatory Visit: Payer: Self-pay

## 2019-02-27 ENCOUNTER — Encounter (HOSPITAL_COMMUNITY): Payer: Self-pay | Admitting: Psychiatry

## 2019-02-27 ENCOUNTER — Ambulatory Visit (INDEPENDENT_AMBULATORY_CARE_PROVIDER_SITE_OTHER): Payer: 59 | Admitting: Psychiatry

## 2019-02-27 DIAGNOSIS — G4700 Insomnia, unspecified: Secondary | ICD-10-CM | POA: Diagnosis not present

## 2019-02-27 DIAGNOSIS — F4323 Adjustment disorder with mixed anxiety and depressed mood: Secondary | ICD-10-CM

## 2019-02-27 MED ORDER — TEMAZEPAM 15 MG PO CAPS: 15.0000 mg | ORAL_CAPSULE | Freq: Every evening | ORAL | 0 refills | Status: DC | PRN

## 2019-02-27 MED ORDER — CLONAZEPAM 0.5 MG PO TABS: 0.5000 mg | ORAL_TABLET | Freq: Every day | ORAL | 0 refills | Status: DC | PRN

## 2019-02-27 NOTE — Patient Instructions (Signed)
1. Continue clonazepam 0.5 mg daily as needed for insomnia 2. Continue temazepam 15 mg at night as needed for insomnia 3. Next appointment 8/24 at 9:20

## 2019-03-09 ENCOUNTER — Telehealth (HOSPITAL_COMMUNITY): Payer: Self-pay | Admitting: *Deleted

## 2019-03-09 ENCOUNTER — Other Ambulatory Visit (HOSPITAL_COMMUNITY): Payer: Self-pay | Admitting: Psychiatry

## 2019-03-09 MED ORDER — TEMAZEPAM 15 MG PO CAPS: 15.0000 mg | ORAL_CAPSULE | Freq: Every evening | ORAL | 1 refills | Status: DC | PRN

## 2019-03-09 MED ORDER — CLONAZEPAM 0.5 MG PO TABS: 0.5000 mg | ORAL_TABLET | Freq: Every day | ORAL | 1 refills | Status: DC | PRN

## 2019-03-09 NOTE — Telephone Encounter (Signed)
PATIENT CALLED & ASKED IF SHE COULD PUSH HER APPT'S BACK TO EVERY  3 MONTHS?  SHE HAS APPT UPCOMING 04/10/2019. SHE STATED PAIN MGMT WOULDN'T CHANGE ANY OF HER MED'S & ASKED IF SHE COULD JUST KEEP APPT'S EVERY  3 MONTHS?

## 2019-03-09 NOTE — Telephone Encounter (Signed)
It is fine. Reschedule her in early October for 30 mins. Please advise her to come back sooner if any worsening in her mood symptoms. I ordered additional refills.

## 2019-03-09 NOTE — Telephone Encounter (Signed)
PATIENT TRYING TO KEEP APPT'S  @ Q 3 MONTHS SINCE PAIN MGMT WOULDN'T CHANGE ANY OF HER MED'S

## 2019-03-16 ENCOUNTER — Other Ambulatory Visit: Payer: Self-pay | Admitting: Adult Health

## 2019-04-10 ENCOUNTER — Ambulatory Visit (HOSPITAL_COMMUNITY): Payer: 59 | Admitting: Psychiatry

## 2019-04-17 ENCOUNTER — Other Ambulatory Visit: Payer: Self-pay | Admitting: Adult Health

## 2019-04-18 ENCOUNTER — Encounter: Payer: Self-pay | Admitting: Adult Health

## 2019-04-19 NOTE — Telephone Encounter (Signed)
Sent to the pharmacy by e-scribe. 

## 2019-05-22 NOTE — Progress Notes (Signed)
Virtual Visit via Video Note  I connected with Christie Hurst on 05/29/19 at 11:00 AM EDT by a video enabled telemedicine application and verified that I am speaking with the correct person using two identifiers.   I discussed the limitations of evaluation and management by telemedicine and the availability of in person appointments. The patient expressed understanding and agreed to proceed.   I discussed the assessment and treatment plan with the patient. The patient was provided an opportunity to ask questions and all were answered. The patient agreed with the plan and demonstrated an understanding of the instructions.   The patient was advised to call back or seek an in-person evaluation if the symptoms worsen or if the condition fails to improve as anticipated.  I provided 25 minutes of non-face-to-face time during this encounter.   [name], MD     [facility] MD/PA/NP OP Progress Note  05/29/2019 11:31 AM Christie Hurst  MRN:  [id number]  Chief Complaint:  Chief Complaint    Follow-up; Insomnia     HPI:  This is a follow-up appointment for insomnia and anxiety.  She states that her pain has much improved since change in her medication (discontinued levorphanol, and was started on hydromorphone ER). She has started to see PT again, which has been helpful.  Although she was feeling down when she was having severe pain, it has improved some. She has been able to do more house chores, and was able to visit her friend's house after several hours of driving. She feels good about this. She takes care of her daughter, who is doing home school. Although she did try graduate school, she was distracted easily and had to drop classes. She also could not continue to work for more than two days due to pain.  Although she feels frustrated about this, she believes she has tried whatever  she could do. She is frustrated with her mother in law, who tends to cancel visiting without good notification.   Although Christie Hurst shares her frustration with her husband, she does not want him to feel a pressure as if he needs to choose between [place] and his mother. 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 She agrees that she tends to be consumed by fear of uncertainty. She has middle insomnia, although it has been improving. She has occasional difficulty in concentration.  She denies feeling depressed.  She has good appetite.  She denies SI.  She feels anxious and tense at times.  She denies panic attacks.   Visit Diagnosis:    ICD-10-CM   1. Insomnia, unspecified type  G47.00     Past Psychiatric History: Please see initial evaluation for full details. I have reviewed the history. No updates at this time.     Past Medical History:  Past Medical History:  Diagnosis Date  . Chronic back pain   . Frequent UTI    d/t congential abnormality   . History of DVT (deep vein thrombosis) 05/2013  . Inappropriate sinus tachycardia   . Phlebitis   . POTS (postural orthostatic tachycardia syndrome)   . Tachycardia   . Urethral spasm   . UTI (urinary tract infection)     Past Surgical History:  Procedure Laterality Date  . CESAREAN SECTION     x 2   . LAMINECTOMY  12/2012   L5/S1  . microdiscetomy  11/2012   L5/S1  .  SPINAL FUSION  03/2013   L5/S1  . stent/DVT  05/2013   Left groin    Family Psychiatric History: Please see initial evaluation for full details. I have reviewed the history. No updates at this time.     Family History:  Family History  Problem Relation Age of Onset  . Early death Mother 46  . Transient ischemic attack Mother   . Depression Mother   . Mental illness Mother   . Transient ischemic attack Father   . Breast cancer Paternal Grandmother   . Breast cancer Paternal Aunt   . Breast cancer Paternal Aunt     Social History:  Social History   Socioeconomic History  . Marital  status: Married    Spouse name: Christie Hurst  . Number of children: 2  . Years of education: Not on file  . Highest education level: Bachelor's degree (e.g., BA, AB, BS)  Occupational History  . Not on file  Social Needs  . Financial resource strain: Not hard at all  . Food insecurity    Worry: Never true    Inability: Never true  . Transportation needs    Medical: No    Non-medical: No  Tobacco Use  . Smoking status: Never Smoker  . Smokeless tobacco: Never Used  Substance and Sexual Activity  . Alcohol use: Never    Frequency: Never  . Drug use: Never  . Sexual activity: Yes  Lifestyle  . Physical activity    Days per week: 0 days    Minutes per session: 0 min  . Stress: Very much  Relationships  . Social Herbalist on phone: Not on file    Gets together: Not on file    Attends religious service: Never    Active member of club or organization: No    Attends meetings of clubs or organizations: Never    Relationship status: Married  Other Topics Concern  . 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 Tamsulosin Hypertension, Photosensitivity, Rash and Shortness Of Breath  . Tramadol Anxiety, Palpitations and Shortness Of Breath  . Cephalexin Hives  . Duloxetine Hcl Other (See Comments)  . Lamotrigine Photosensitivity  . Remeron [Mirtazapine]     Chest pain  . Seroquel [Quetiapine Fumarate]     "out of it" it opposite of what it was suppose to do  . Sulfamethoxazole-Trimethoprim Hives and Palpitations    Metabolic Disorder Labs: No results found for: HGBA1C, MPG No results found for: PROLACTIN No results found for: CHOL, TRIG, HDL, CHOLHDL, VLDL, LDLCALC No results found for: TSH  Therapeutic Level Labs: No results found for: LITHIUM No results found for: VALPROATE No components found for:  CBMZ  Current Medications: Current  Outpatient Medications  Medication Sig Dispense Refill  . HYDROmorphone HCl (EXALGO) 8 MG T24A SR tablet Take 8 mg by mouth daily.    Marland Kitchen acebutolol (SECTRAL) 200 MG capsule TAKE 1 CAPSULE(200 MG) BY MOUTH DAILY 90 capsule 1  . amitriptyline (ELAVIL) 25 MG tablet TAKE 1 TABLET(25 MG) BY MOUTH AT BEDTIME 90 tablet 0  . clonazePAM (KLONOPIN) 0.5 MG tablet Take 1 tablet (0.5 mg total) by mouth daily as needed for anxiety. 30 tablet 0  . [START ON 06/12/2019] clonazePAM (KLONOPIN) 0.5 MG tablet Take 1 tablet (0.5 mg total) by  mouth daily as needed for anxiety. 30 tablet 2  . HYDROmorphone (DILAUDID) 2 MG tablet Take 1 tablet (2 mg total) by mouth every 6 (six) hours as needed for severe pain. 20 tablet 0  . nitrofurantoin (MACRODANTIN) 100 MG capsule TAKE 1 CAPSULE(100 MG) BY MOUTH DAILY 90 capsule 3  . olopatadine (PATANOL) 0.1 % ophthalmic solution Place 1 drop into both eyes daily. 5 mL 3  . temazepam (RESTORIL) 15 MG capsule Take 1 capsule (15 mg total) by mouth at bedtime as needed for sleep. 30 capsule 0  . [START ON 06/12/2019] temazepam (RESTORIL) 15 MG capsule Take 1 capsule (15 mg total) by mouth at bedtime as needed for sleep. 30 capsule 2   No current facility-administered medications for this visit.      Musculoskeletal: Strength & Muscle Tone: N/A Gait & Station: N/A Patient leans: N/A  Psychiatric Specialty Exam: Review of Systems  Psychiatric/Behavioral: Negative for depression, hallucinations, memory loss, substance abuse and suicidal ideas. The patient is nervous/anxious and has insomnia.   All other systems reviewed and are negative.   There were no vitals taken for this visit.There is no height or weight on file to calculate BMI.  General Appearance: Fairly Groomed  Eye Contact:  Good  Speech:  Clear and Coherent  Volume:  Normal  Mood:  "better""  Affect:  Appropriate, Congruent and less tense  Thought Process:  Coherent  Orientation:  Full (Time, Place, and Person)   Thought Content: Logical   Suicidal Thoughts:  No  Homicidal Thoughts:  No  Memory:  Immediate;   Good  Judgement:  Good  Insight:  Fair  Psychomotor Activity:  Normal  Concentration:  Concentration: Good and Attention Span: Good  Recall:  Good  Fund of Knowledge: Good  Language: Good  Akathisia:  No  Handed:  Right  AIMS (if indicated): not done  Assets:  Communication Skills Desire for Improvement  ADL's:  Intact  Cognition: WNL  Sleep:  Fair   Screenings:   Assessment and Plan:  Christie Hurst is a 43 y.o. year old female with a history of insomnia,  chronic pain syndrome,multiple back surgeries with complications,urethral spasm,congenital anomaly of the ureter, inappropriate tachycardia, who presents for follow up appointment for Insomnia, unspecified type  # Insomnia There has been overall improvement in insomnia as her pain improves.  Will continue clonazepam and temazepam for insomnia. Although it is preferable not to use benzodiazepine for insomnia, especially with concomitant use of opioid, will continue this medication given the benefits outweighs risk.  There has been no concerning behavior of overusing medication.  Discussed risk of dependence and oversedation.  Noted that she does have history of adverse reaction to trials of hypnotics as described below. Discussed sleep hygiene.   #Adjustment disorder with anxiety.  She reports ongoing anxiety in the context of conflict with her family in law.  Other psychosocial stressors includes chronic pain. Discussed cognitive defusion and coached cognitive restructuring. Although she will greatly benefit from ACT/CBT, she is unable to do so due to schedule conflict. Will continue to discuss as needed.    Plan I have reviewed and updated plans as below 1. Continue clonazepam 0.5 mg daily as needed forinsomnia  2. Continue temazepam 15 mg at night as needed forinsomnia 3.Next appointment: in January - on  buprenorphine, amitriptyline 25 mg at night(limited benefit from higher dose; prescribed by urology)  Past trials of medication:citalopram (jaw stiffness), sertraline (restless), fluoxetine (restless),duloxetine,mirtazapine (chest pain),Quetiapine- "sick,"Baclofen- crazy dreams,hydroxyzine (hyperactive),gabapentin (  drowsiness),melatonin,Ambien (middle insomnia),trazodone, temazepam, clonazepam,  The patient demonstrates the following risk factors for suicide: Chronic risk factors for suicide include:psychiatric disorder ofanxietyand chronic pain. Acute risk factorsfor suicide include: family or marital conflict and unemployment. Protective factorsfor this patient include: positive social support, responsibility to others (children, family), coping skills and hope for the future. Considering these factors, the overall suicide risk at this point appears to below. Patientisappropriate for outpatient follow up.  The duration of this appointment visit was 25 minutes of non face-to-face time with the patient.  Greater than 50% of this time was spent in counseling, explanation of  diagnosis, planning of further management, and coordination of care.  Neysa Hottereina Bhavin Monjaraz, MD 05/29/2019, 11:31 AM

## 2019-05-29 ENCOUNTER — Encounter (HOSPITAL_COMMUNITY): Payer: Self-pay | Admitting: Psychiatry

## 2019-05-29 ENCOUNTER — Ambulatory Visit (INDEPENDENT_AMBULATORY_CARE_PROVIDER_SITE_OTHER): Payer: 59 | Admitting: Psychiatry

## 2019-05-29 ENCOUNTER — Other Ambulatory Visit: Payer: Self-pay

## 2019-05-29 DIAGNOSIS — G4700 Insomnia, unspecified: Secondary | ICD-10-CM | POA: Diagnosis not present

## 2019-05-29 DIAGNOSIS — F4322 Adjustment disorder with anxiety: Secondary | ICD-10-CM | POA: Diagnosis not present

## 2019-05-29 MED ORDER — TEMAZEPAM 15 MG PO CAPS: 15.0000 mg | ORAL_CAPSULE | Freq: Every evening | ORAL | 2 refills | Status: DC | PRN

## 2019-05-29 MED ORDER — CLONAZEPAM 0.5 MG PO TABS: 0.5000 mg | ORAL_TABLET | Freq: Every day | ORAL | 2 refills | Status: DC | PRN

## 2019-05-29 NOTE — Patient Instructions (Signed)
1. Continue clonazepam 0.5 mg daily as needed forinsomnia  2. Continue temazepam 15 mg at night as needed forinsomnia 3.Next appointment: in January

## 2019-05-31 ENCOUNTER — Encounter: Payer: Self-pay | Admitting: Adult Health

## 2019-06-01 ENCOUNTER — Encounter: Payer: Self-pay | Admitting: Adult Health

## 2019-06-12 ENCOUNTER — Encounter: Payer: Self-pay | Admitting: Adult Health

## 2019-06-16 ENCOUNTER — Encounter: Payer: Self-pay | Admitting: Adult Health

## 2019-06-16 ENCOUNTER — Encounter: Payer: 59 | Admitting: Adult Health

## 2019-06-16 ENCOUNTER — Other Ambulatory Visit: Payer: Self-pay

## 2019-06-16 ENCOUNTER — Ambulatory Visit (INDEPENDENT_AMBULATORY_CARE_PROVIDER_SITE_OTHER): Payer: 59 | Admitting: Adult Health

## 2019-06-16 VITALS — BP 100/74 | Temp 98.0°F | Ht 64.25 in | Wt 125.0 lb

## 2019-06-16 DIAGNOSIS — M5442 Lumbago with sciatica, left side: Secondary | ICD-10-CM | POA: Diagnosis not present

## 2019-06-16 DIAGNOSIS — Z0000 Encounter for general adult medical examination without abnormal findings: Secondary | ICD-10-CM

## 2019-06-16 DIAGNOSIS — R000 Tachycardia, unspecified: Secondary | ICD-10-CM

## 2019-06-16 DIAGNOSIS — IMO0002 Reserved for concepts with insufficient information to code with codable children: Secondary | ICD-10-CM

## 2019-06-16 DIAGNOSIS — N390 Urinary tract infection, site not specified: Secondary | ICD-10-CM

## 2019-06-16 DIAGNOSIS — G4700 Insomnia, unspecified: Secondary | ICD-10-CM

## 2019-06-16 DIAGNOSIS — N3592 Unspecified urethral stricture, female: Secondary | ICD-10-CM

## 2019-06-16 DIAGNOSIS — Z23 Encounter for immunization: Secondary | ICD-10-CM

## 2019-06-16 DIAGNOSIS — I4711 Inappropriate sinus tachycardia, so stated: Secondary | ICD-10-CM

## 2019-06-16 DIAGNOSIS — N35919 Unspecified urethral stricture, male, unspecified site: Secondary | ICD-10-CM

## 2019-06-16 DIAGNOSIS — G8929 Other chronic pain: Secondary | ICD-10-CM

## 2019-06-16 LAB — CBC WITH DIFFERENTIAL/PLATELET
Basophils Absolute: 0 10*3/uL (ref 0.0–0.1)
Basophils Relative: 0.6 % (ref 0.0–3.0)
Eosinophils Absolute: 0.1 10*3/uL (ref 0.0–0.7)
Eosinophils Relative: 1.2 % (ref 0.0–5.0)
HCT: 38.5 % (ref 36.0–46.0)
Hemoglobin: 13 g/dL (ref 12.0–15.0)
Lymphocytes Relative: 28.4 % (ref 12.0–46.0)
Lymphs Abs: 2.1 10*3/uL (ref 0.7–4.0)
MCHC: 33.9 g/dL (ref 30.0–36.0)
MCV: 101.6 fl — ABNORMAL HIGH (ref 78.0–100.0)
Monocytes Absolute: 0.4 10*3/uL (ref 0.1–1.0)
Monocytes Relative: 5.7 % (ref 3.0–12.0)
Neutro Abs: 4.7 10*3/uL (ref 1.4–7.7)
Neutrophils Relative %: 64.1 % (ref 43.0–77.0)
Platelets: 236 10*3/uL (ref 150.0–400.0)
RBC: 3.79 Mil/uL — ABNORMAL LOW (ref 3.87–5.11)
RDW: 12.7 % (ref 11.5–15.5)
WBC: 7.4 10*3/uL (ref 4.0–10.5)

## 2019-06-16 LAB — COMPREHENSIVE METABOLIC PANEL
ALT: 9 U/L (ref 0–35)
AST: 15 U/L (ref 0–37)
Albumin: 4.7 g/dL (ref 3.5–5.2)
Alkaline Phosphatase: 51 U/L (ref 39–117)
BUN: 14 mg/dL (ref 6–23)
CO2: 29 mEq/L (ref 19–32)
Calcium: 9.3 mg/dL (ref 8.4–10.5)
Chloride: 103 mEq/L (ref 96–112)
Creatinine, Ser: 0.92 mg/dL (ref 0.40–1.20)
GFR: 66 mL/min (ref >60.00)
Glucose, Bld: 96 mg/dL (ref 70–99)
Potassium: 4.3 mEq/L (ref 3.5–5.1)
Sodium: 139 mEq/L (ref 135–145)
Total Bilirubin: 1.1 mg/dL (ref 0.2–1.2)
Total Protein: 7.2 g/dL (ref 6.0–8.3)

## 2019-06-16 LAB — LIPID PANEL
Cholesterol: 224 mg/dL — ABNORMAL HIGH (ref 0–200)
HDL: 64.5 mg/dL (ref >39.00)
LDL Cholesterol: 136 mg/dL — ABNORMAL HIGH (ref 0–99)
NonHDL: 159.42
Total CHOL/HDL Ratio: 3
Triglycerides: 118 mg/dL (ref 0.0–149.0)
VLDL: 23.6 mg/dL (ref 0.0–40.0)

## 2019-06-16 LAB — HEMOGLOBIN A1C: Hgb A1c MFr Bld: 5.2 % (ref 4.6–6.5)

## 2019-06-16 LAB — TSH: TSH: 0.73 u[IU]/mL (ref 0.35–4.50)

## 2019-06-16 NOTE — Progress Notes (Signed)
Subjective:    Patient ID: Christie Hurst, female    DOB: Mar 08, 1976, 43 y.o.   MRN: [id number]  HPI  Patient presents for yearly preventative medicine examination. She is a pleasant 43 year old female who  has a past medical history of Chronic back pain, Frequent UTI, History of DVT (deep vein thrombosis) (05/2013), Inappropriate sinus tachycardia, Phlebitis, POTS (postural orthostatic tachycardia syndrome), Tachycardia, Urethral spasm, and UTI (urinary tract infection).   H/o inappropriate sinus tachycardia- well controlled on Sectral 200 mg.   Urethral Spams - Takes Elavil 25 mg QHS. Denies issues   H/O Chronic UTI's - Takes Macrobid daily. No recent UTI's   Chronic Pain s/p lumbar fusion/laminectomy, and microdiscectomy.  Her pain is currently managed by pain management.  She is currently prescribed hydromorphone 8 mg extended release daily and hydromorphone 2 mg every 6 hours for breakthrough pain.  She has been tried on various other pain medications in the past but had reactions to them.  She reports that this works pretty well but continues to be in pain every day.  She is unable to do much around the house feels as though she has some minor improvement in her movements.  She has started to see physical therapy again and this has been helpful.  She does need paperwork filled out for deferment of student loans since she cannot go back to work or go to school due to her disability.  Insomnia -due to chronic pain.  She is currently prescribed Restoril 15 mg and Klonopin 0.5 mg nightly.  Since her pain has gotten under slightly better control she is sleeping better.  Medications are managed by psychiatry  All immunizations and health maintenance protocols were reviewed with the patient and needed orders were placed.  Appropriate screening laboratory values were ordered for the patient including screening of hyperlipidemia, renal function and hepatic function.   Medication  reconciliation,  past medical history, social history, problem list and allergies were reviewed in detail with the patient  Goals were established with regard to weight loss, exercise, and  diet in compliance with medications  She is due for mammogram and Pap does not want to complete these at this time.  Review of Systems  Constitutional: Negative.   HENT: Negative.   Eyes: Negative.   Respiratory: Negative.   Cardiovascular: Negative.   Gastrointestinal: Negative.   Endocrine: Negative.   Genitourinary: Negative.   Musculoskeletal: Positive for arthralgias, back pain and gait problem.  Skin: Negative.   Allergic/Immunologic: Negative.   Hematological: Negative.   Psychiatric/Behavioral: Negative.   All other systems reviewed and are negative.  Past Medical History:  Diagnosis Date  . Chronic back pain   . Frequent UTI    d/t congential abnormality   . History of DVT (deep vein thrombosis) 05/2013  . Inappropriate sinus tachycardia   . Phlebitis   . POTS (postural orthostatic tachycardia syndrome)   . Tachycardia   . Urethral spasm   . UTI (urinary tract infection)     Social History   Socioeconomic History  . Marital status: Married    Spouse name: jamie  . Number of children: 2  . Years of education: Not on file  . Highest education level: Bachelor's degree (e.g., BA, AB, BS)  Occupational History  . Not on file  Social Needs  . Financial resource strain: Not hard at all  . Food insecurity    Worry: Never true    Inability: Never true  . Transportation  needs    Medical: No    Non-medical: No  Tobacco Use  . Smoking status: Never Smoker  . Smokeless tobacco: Never Used  Substance and Sexual Activity  . Alcohol use: Never    Frequency: Never  . Drug use: Never  . Sexual activity: Yes  Lifestyle  . Physical activity    Days per week: 0 days    Minutes per session: 0 min  . Stress: Very much  Relationships  . Social Musician on phone:  Not on file    Gets together: Not on file    Attends religious service: Never    Active member of club or organization: No    Attends meetings of clubs or organizations: Never    Relationship status: Married  . Intimate partner violence    Fear of current or ex partner: No    Emotionally abused: No    Physically abused: No    Forced sexual activity: No  Other Topics Concern  . 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 CESAREAN SECTION     x 2   . LAMINECTOMY  12/2012   L5/S1  . microdiscetomy  11/2012   L5/S1  . SPINAL FUSION  03/2013   L5/S1  . stent/DVT  05/2013   Left groin    Family History  Problem Relation Age of Onset  . Early death Mother 58  . Transient ischemic attack Mother   . Depression Mother   . Mental illness Mother   . Transient ischemic attack Father   . Breast cancer Paternal Grandmother   . Breast cancer Paternal Aunt   . Breast cancer Paternal Aunt     Allergies  Allergen Reactions  . Tamsulosin Hypertension, Photosensitivity, Rash and Shortness Of Breath  . Tramadol Anxiety, Palpitations and Shortness Of Breath  . Cephalexin Hives  . Duloxetine Hcl Other (See Comments)  . Lamotrigine Photosensitivity  . Levorphanol   . Remeron [Mirtazapine]     Chest pain  . Seroquel [Quetiapine Fumarate]     "out of it" it opposite of what it was suppose to do  . Buprenorphine Hcl Anxiety and Palpitations  . Sulfamethoxazole-Trimethoprim Hives and Palpitations    Current Outpatient Medications on File Prior to Visit  Medication Sig Dispense Refill  . acebutolol (SECTRAL) 200 MG capsule TAKE 1 CAPSULE(200 MG) BY MOUTH DAILY 90 capsule 1  . amitriptyline (ELAVIL) 25 MG tablet TAKE 1 TABLET(25 MG) BY MOUTH AT BEDTIME 90 tablet 0  . clonazePAM (KLONOPIN) 0.5 MG tablet     . HYDROmorphone (DILAUDID) 2 MG tablet Take 1 tablet (2  mg total) by mouth every 6 (six) hours as needed for severe pain. 20 tablet 0  . HYDROmorphone HCl (EXALGO) 8 MG T24A SR tablet Take 8 mg by mouth daily.    . nitrofurantoin (MACRODANTIN) 100 MG capsule TAKE 1 CAPSULE(100 MG) BY MOUTH DAILY 90 capsule 3  . olopatadine (PATANOL) 0.1 % ophthalmic solution Place 1 drop into both eyes daily. 5 mL 3  . temazepam (RESTORIL) 15 MG capsule Take 1 capsule (15 mg total) by mouth at bedtime as needed for sleep. 30 capsule 2   No current facility-administered medications on file prior to visit.     BP 100/74   Temp 98 F (36.7  C) (Temporal)   Ht 5' 4.25" (1.632 m) Comment: WITHOUT SHOES  Wt 125 lb (56.7 kg)   BMI 21.29 kg/m       Objective:   Physical Exam Vitals signs and nursing note reviewed.  Constitutional:      General: She is not in acute distress.    Appearance: Normal appearance.  HENT:     Head: Normocephalic and atraumatic.     Right Ear: Tympanic membrane, ear canal and external ear normal. There is no impacted cerumen.     Left Ear: Tympanic membrane, ear canal and external ear normal. There is no impacted cerumen.     Nose: Nose normal. No congestion or rhinorrhea.     Mouth/Throat:     Mouth: Mucous membranes are moist.     Pharynx: Oropharynx is clear. No oropharyngeal exudate or posterior oropharyngeal erythema.  Eyes:     General: No scleral icterus.       Right eye: No discharge.        Left eye: No discharge.     Extraocular Movements: Extraocular movements intact.     Conjunctiva/sclera: Conjunctivae normal.     Pupils: Pupils are equal, round, and reactive to light.  Neck:     Musculoskeletal: Normal range of motion and neck supple.     Vascular: No carotid bruit.  Cardiovascular:     Rate and Rhythm: Normal rate and regular rhythm.     Pulses: Normal pulses.     Heart sounds: Normal heart sounds. No murmur. No friction rub. No gallop.   Pulmonary:     Effort: Pulmonary effort is normal. No respiratory  distress.     Breath sounds: Normal breath sounds. No stridor. No wheezing, rhonchi or rales.  Chest:     Chest wall: No tenderness.  Abdominal:     General: Abdomen is flat. Bowel sounds are normal. There is no distension.     Palpations: Abdomen is soft. There is no mass.     Tenderness: There is no abdominal tenderness. There is no right CVA tenderness, left CVA tenderness, guarding or rebound.     Hernia: No hernia is present.  Musculoskeletal:        General: Tenderness present. No swelling, deformity or signs of injury.     Right lower leg: No edema.     Left lower leg: No edema.     Comments: + straight leg raise.  Changes positions frequently during exam   Skin:    General: Skin is warm and dry.     Capillary Refill: Capillary refill takes less than 2 seconds.     Coloration: Skin is not jaundiced or pale.     Findings: No bruising, erythema, lesion or rash.  Neurological:     General: No focal deficit present.     Mental Status: She is alert and oriented to person, place, and time.     Coordination: Coordination normal.     Gait: Gait abnormal.     Deep Tendon Reflexes: Reflexes normal.     Comments: Slow gait, slightly crouched   Psychiatric:        Mood and Affect: Mood normal.        Behavior: Behavior normal.        Thought Content: Thought content normal.        Judgment: Judgment normal.       Assessment & Plan:  1. Routine general medical examination at a health care facility - Follow up in one  year or sooner if needed - CBC with Differential/Platelet - Comprehensive metabolic panel - Hemoglobin A1c - TSH - Lipid panel  2. Need for prophylactic vaccination and inoculation against influenza  - Flu Vaccine QUAD 6+ mos PF IM (Fluarix Quad PF)  3. Chronic bilateral low back pain with left-sided sciatica Follow-up with pain management as directed.  I am glad she is back to doing physical therapy.  Encouraged to do as much movement at home as she possibly  can - Will fill it out form for student loan deferral   4. Urethral spasm -Continue with Elavil  5. Inappropriate sinus tachycardia -Continue with acebutolol   6. Insomnia, unspecified type -Continue with plan of care by psychiatry  7. Chronic UTI - Continue with Macrobid  Shirline Frees, NP

## 2019-06-21 ENCOUNTER — Encounter: Payer: Self-pay | Admitting: Adult Health

## 2019-06-30 ENCOUNTER — Other Ambulatory Visit: Payer: Self-pay

## 2019-06-30 DIAGNOSIS — Z20822 Contact with and (suspected) exposure to covid-19: Secondary | ICD-10-CM

## 2019-07-03 LAB — NOVEL CORONAVIRUS, NAA: SARS-CoV-2, NAA: NOT DETECTED

## 2019-07-11 ENCOUNTER — Other Ambulatory Visit: Payer: Self-pay | Admitting: Nurse Practitioner

## 2019-07-11 DIAGNOSIS — M961 Postlaminectomy syndrome, not elsewhere classified: Secondary | ICD-10-CM

## 2019-07-20 ENCOUNTER — Encounter: Payer: Self-pay | Admitting: Adult Health

## 2019-07-20 MED ORDER — AMITRIPTYLINE HCL 25 MG PO TABS: ORAL_TABLET | ORAL | 1 refills | Status: DC

## 2019-08-02 ENCOUNTER — Ambulatory Visit
Admission: RE | Admit: 2019-08-02 | Discharge: 2019-08-02 | Disposition: A | Discharge status: Home / Self Care | Payer: 59 | Source: Ambulatory Visit | Attending: Nurse Practitioner | Admitting: Nurse Practitioner

## 2019-08-02 DIAGNOSIS — M961 Postlaminectomy syndrome, not elsewhere classified: Secondary | ICD-10-CM

## 2019-08-02 MED ORDER — GADOBENATE DIMEGLUMINE 529 MG/ML IV SOLN
12.0000 mL | Freq: Once | INTRAVENOUS | Status: AC | PRN
  Administered 2019-08-02: 12 mL via INTRAVENOUS

## 2019-08-29 ENCOUNTER — Ambulatory Visit (HOSPITAL_COMMUNITY): Payer: 59 | Admitting: Psychiatry

## 2019-09-11 ENCOUNTER — Other Ambulatory Visit: Payer: Self-pay

## 2019-09-11 ENCOUNTER — Encounter: Payer: Self-pay | Admitting: Psychiatry

## 2019-09-11 ENCOUNTER — Ambulatory Visit (INDEPENDENT_AMBULATORY_CARE_PROVIDER_SITE_OTHER): Payer: 59 | Admitting: Psychiatry

## 2019-09-11 DIAGNOSIS — F4323 Adjustment disorder with mixed anxiety and depressed mood: Secondary | ICD-10-CM

## 2019-09-11 DIAGNOSIS — G4700 Insomnia, unspecified: Secondary | ICD-10-CM | POA: Diagnosis not present

## 2019-09-11 MED ORDER — CLONAZEPAM 0.5 MG PO TABS: 0.5000 mg | ORAL_TABLET | Freq: Every evening | ORAL | 0 refills | Status: DC | PRN

## 2019-09-11 MED ORDER — TEMAZEPAM 30 MG PO CAPS: 30.0000 mg | ORAL_CAPSULE | Freq: Every evening | ORAL | 1 refills | Status: DC | PRN

## 2019-09-11 NOTE — Progress Notes (Signed)
BH MD OP Progress Note  I connected with  Christie Hurst on 09/11/19 by a video enabled telemedicine application and verified that I am speaking with the correct person using two identifiers.   I discussed the limitations of evaluation and management by telemedicine. The patient expressed understanding and agreed to proceed.    09/11/2019 8:54 AM Christie Hurst  MRN:  [id number]  Chief Complaint: " I am doing fine but still having trouble with sleep."  HPI: Patient reported that her sleep is still not well regulated.  She takes both clonazepam 0.5 mg with temazepam 15 mg at bedtime and still gets only few hours of sleep with early morning awakening.  She reported that this happens at least 4 out of 7 nights in a week. PDMP was reviewed.  It was noted that patient is now also prescribed hydromorphone ER total dose of 12 mg for pain.  Patient reported that her pain has been under control with hydromorphone.  She takes it in the morning time. She was asked if she has tried any higher dose of temazepam in the past and she reported no which was confirmed from PDMP review. Patient was willing to try higher dose of temazepam to see if that helps her with insomnia. Patient reported that once Christmas was over her mood has been more stable and things are going well overall.  Visit Diagnosis:    ICD-10-CM   1. Adjustment disorder with mixed anxiety and depressed mood  F43.23   2. Insomnia, unspecified type  G47.00     Past Psychiatric History: Insomnia, anxiety  Past Medical History:  Past Medical History:  Diagnosis Date  . Chronic back pain   . Frequent UTI    d/t congential abnormality   . History of DVT (deep vein thrombosis) 05/2013  . Inappropriate sinus tachycardia   . Phlebitis   . POTS (postural orthostatic tachycardia syndrome)   . Tachycardia   . Urethral spasm   . UTI (urinary tract infection)     Past Surgical History:  Procedure Laterality Date  . CESAREAN SECTION      x 2   . LAMINECTOMY  12/2012   L5/S1  . microdiscetomy  11/2012   L5/S1  . SPINAL FUSION  03/2013   L5/S1  . stent/DVT  05/2013   Left groin    Family Psychiatric History: see below  Family History:  Family History  Problem Relation Age of Onset  . Early death Mother 43  . Transient ischemic attack Mother   . Depression Mother   . Mental illness Mother   . Transient ischemic attack Father   . Breast cancer Paternal Grandmother   . Breast cancer Paternal Aunt   . Breast cancer Paternal Aunt     Social History:  Social History   Socioeconomic History  . Marital status: Married    Spouse name: jamie  . Number of children: 2  . Years of education: Not on file  . Highest education level: Bachelor's degree (e.g., BA, AB, BS)  Occupational History  . Not on file  Tobacco Use  . Smoking status: Never Smoker  . Smokeless tobacco: Never Used  Substance and Sexual Activity  . Alcohol use: Never  . Drug use: Never  . Sexual activity: Yes  Other Topics Concern  . 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 Difficulty of Paying Living Expenses: Not on file  Food Insecurity:   . Worried About Charity fundraiser in the Last Year: Not on file  . Ran Out of Food in the Last Year: Not on file  Transportation Needs:   . Lack of Transportation (Medical): Not on file  . Lack of Transportation (Non-Medical): Not on file  Physical Activity:   . Days of Exercise per Week: Not on file  . Minutes of Exercise per Session: Not on file  Stress:   . Feeling of Stress : Not on file  Social Connections:   . Frequency of Communication with Friends and Family: Not on file  . Frequency of Social Gatherings with Friends and Family: Not on file  . Attends Religious Services: Not on file  . Active Member of Clubs or Organizations: Not on file  .  Attends Archivist Meetings: Not on file  . Marital Status: Not on file    Allergies:  Allergies  Allergen Reactions  . Tamsulosin Hypertension, Photosensitivity, Rash and Shortness Of Breath  . Tramadol Anxiety, Palpitations and Shortness Of Breath  . Cephalexin Hives  . Duloxetine Hcl Other (See Comments)  . Lamotrigine Photosensitivity  . Levorphanol   . Remeron [Mirtazapine]     Chest pain  . Seroquel [Quetiapine Fumarate]     "out of it" it opposite of what it was suppose to do  . Buprenorphine Hcl Anxiety and Palpitations  . Sulfamethoxazole-Trimethoprim Hives and Palpitations    Metabolic Disorder Labs: Lab Results  Component Value Date   HGBA1C 5.2 06/16/2019   No results found for: PROLACTIN Lab Results  Component Value Date   CHOL 224 (H) 06/16/2019   TRIG 118.0 06/16/2019   HDL 64.50 06/16/2019   CHOLHDL 3 06/16/2019   VLDL 23.6 06/16/2019   LDLCALC 136 (H) 06/16/2019   Lab Results  Component Value Date   TSH 0.73 06/16/2019    Therapeutic Level Labs: No results found for: LITHIUM No results found for: VALPROATE No components found for:  CBMZ  Current Medications: Current Outpatient Medications  Medication Sig Dispense Refill  . acebutolol (SECTRAL) 200 MG capsule TAKE 1 CAPSULE(200 MG) BY MOUTH DAILY 90 capsule 1  . amitriptyline (ELAVIL) 25 MG tablet TAKE 1 TABLET(25 MG) BY MOUTH AT BEDTIME 90 tablet 1  . clonazePAM (KLONOPIN) 0.5 MG tablet     . HYDROmorphone (DILAUDID) 2 MG tablet Take 1 tablet (2 mg total) by mouth every 6 (six) hours as needed for severe pain. 20 tablet 0  . HYDROmorphone HCl (EXALGO) 8 MG T24A SR tablet Take 8 mg by mouth daily.    . nitrofurantoin (MACRODANTIN) 100 MG capsule TAKE 1 CAPSULE(100 MG) BY MOUTH DAILY 90 capsule 3  . olopatadine (PATANOL) 0.1 % ophthalmic solution Place 1 drop into both eyes daily. 5 mL 3  . temazepam (RESTORIL) 15 MG capsule Take 1 capsule (15 mg total) by mouth at bedtime as needed for  sleep. 30 capsule 2   No current facility-administered medications for this visit.     Musculoskeletal: Strength & Muscle Tone: unable to assess due to telemed visit Gait & Station: unable to assess due to telemed visit Patient leans: unable to assess due to telemed visit   Psychiatric Specialty Exam: Review of Systems  There were no vitals taken for this visit.There is no height or weight on file to calculate BMI.  General Appearance: Well  Groomed  Eye Contact:  Good  Speech:  Clear and Coherent and Normal Rate  Volume:  Normal  Mood:  Anxious  Affect:  Congruent  Thought Process:  Goal Directed and Descriptions of Associations: Intact  Orientation:  Full (Time, Place, and Person)  Thought Content: Logical   Suicidal Thoughts:  No  Homicidal Thoughts:  No  Memory:  Recent;   Good Remote;   Good  Judgement:  Fair  Insight:  Fair  Psychomotor Activity:  Normal  Concentration:  Concentration: Good and Attention Span: Good  Recall:  Good  Fund of Knowledge: Good  Language: Good  Akathisia:  Negative  Handed:  Right  AIMS (if indicated): not done  Assets:  Communication Skills Desire for Improvement Financial Resources/Insurance Housing  ADL's:  Intact  Cognition: WNL  Sleep:  Poor    Assessment and Plan: This is a 44 year old female with history of insomnia, chronic pain syndrome,multiple back surgeries with complications,urethral spasm,congenital anomaly of the ureter, inappropriate tachycardia, who was seen for follow-up. Patient is still reporting ongoing difficulty in maintaining sleep.  Of note, patient has been prescribed 2 benzodiazepines along with hydromorphone by the caring providers despite the risks associated with combination of benzodiazepine with opioid medication. I advised the patient to try higher dose of temazepam at 30 mg and see if she can manage without taking additional clonazepam at night for insomnia.  Patient verbalized her  understanding.  1. Adjustment disorder with mixed anxiety and depressed mood   2. Insomnia, unspecified type - clonazePAM (KLONOPIN) 0.5 MG tablet; Take 1 tablet (0.5 mg total) by mouth at bedtime as needed (insomnia).  Dispense: 30 tablet; Refill: 0 - temazepam (RESTORIL) 30 MG capsule; Take 1 capsule (30 mg total) by mouth at bedtime as needed for sleep.  Dispense: 30 capsule; Refill: 1  F/up with Dr. Vanetta Shawl in 2 months.    Zena Amos, MD 09/11/2019, 8:54 AM

## 2019-10-09 ENCOUNTER — Telehealth: Payer: Self-pay

## 2019-10-09 NOTE — Telephone Encounter (Signed)
need refill on the clonazepam .5mg 

## 2019-10-09 NOTE — Telephone Encounter (Signed)
received fax requesting a refill last seen dr. Ponciano Ort but pt has follow up with dr,. hisada

## 2019-10-10 ENCOUNTER — Other Ambulatory Visit (HOSPITAL_COMMUNITY): Payer: Self-pay | Admitting: Psychiatry

## 2019-10-10 DIAGNOSIS — G4700 Insomnia, unspecified: Secondary | ICD-10-CM

## 2019-10-10 MED ORDER — CLONAZEPAM 0.5 MG PO TABS: 0.5000 mg | ORAL_TABLET | Freq: Every evening | ORAL | 0 refills | Status: DC | PRN

## 2019-10-10 NOTE — Telephone Encounter (Signed)
ordered

## 2019-11-01 NOTE — Progress Notes (Deleted)
[facility] MD/PA/NP OP Progress Note  11/01/2019 12:48 PM Christie Hurst  MRN:  [id number]  Chief Complaint:  HPI:  - temazepam was uptitrated by Dr. Evelene Croon at her last visit Visit Diagnosis: No diagnosis found.  Past Psychiatric History:  Please see initial evaluation for full details. I have reviewed the history. No updates at this time.     Past Medical History:  Past Medical History:  Diagnosis Date  . Chronic back pain   . Frequent UTI    d/t congential abnormality   . History of DVT (deep vein thrombosis) 05/2013  . Inappropriate sinus tachycardia   . Phlebitis   . POTS (postural orthostatic tachycardia syndrome)   . Tachycardia   . Urethral spasm   . UTI (urinary tract infection)     Past Surgical History:  Procedure Laterality Date  . CESAREAN SECTION     x 2   . LAMINECTOMY  12/2012   L5/S1  . microdiscetomy  11/2012   L5/S1  . SPINAL FUSION  03/2013   L5/S1  . stent/DVT  05/2013   Left groin    Family Psychiatric History: Please see initial evaluation for full details. I have reviewed the history. No updates at this time.     Family History:  Family History  Problem Relation Age of Onset  . Early death Mother 60  . Transient ischemic attack Mother   . Depression Mother   . Mental illness Mother   . Transient ischemic attack Father   . Breast cancer Paternal Grandmother   . Breast cancer Paternal Aunt   . Breast cancer Paternal Aunt     Social History:  Social History   Socioeconomic History  . Marital status: Married    Spouse name: jamie  . Number of children: 2  . Years of education: Not on file  . Highest education level: Bachelor's degree (e.g., BA, AB, BS)  Occupational History  . Not on file  Tobacco Use  . Smoking status: Never Smoker  . Smokeless tobacco: Never Used  Substance and Sexual Activity  . Alcohol use: Never  . Drug use: Never  . Sexual activity: Yes  Other Topics Concern  . 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 Difficulty of Paying Living Expenses:   Food Insecurity:   . Worried About Programme researcher, broadcasting/film/video in the Last Year:   . Barista in the Last Year:   Transportation Needs:   . Freight forwarder (Medical):   Marland Kitchen Lack of Transportation (Non-Medical):   Physical Activity:   . Days of Exercise per Week:   . Minutes of Exercise per Session:   Stress:   . Feeling of Stress :   Social Connections:   . Frequency of Communication with Friends and Family:   . Frequency of Social Gatherings with Friends and Family:   . Attends Religious Services:   . Active Member of Clubs or Organizations:   . Attends Banker Meetings:   Marland Kitchen Marital Status:     Allergies:  Allergies  Allergen Reactions  . Tamsulosin Hypertension, Photosensitivity, Rash and Shortness Of Breath  . Tramadol Anxiety, Palpitations and Shortness Of Breath  . Cephalexin Hives  . Duloxetine Hcl Other (See Comments)  . Lamotrigine Photosensitivity  .  Levorphanol   . Remeron [Mirtazapine]     Chest pain  . Seroquel [Quetiapine Fumarate]     "out of it" it opposite of what it was suppose to do  . Buprenorphine Hcl Anxiety and Palpitations  . Sulfamethoxazole-Trimethoprim Hives and Palpitations    Metabolic Disorder Labs: Lab Results  Component Value Date   HGBA1C 5.2 06/16/2019   No results found for: PROLACTIN Lab Results  Component Value Date   CHOL 224 (H) 06/16/2019   TRIG 118.0 06/16/2019   HDL 64.50 06/16/2019   CHOLHDL 3 06/16/2019   VLDL 23.6 06/16/2019   LDLCALC 136 (H) 06/16/2019   Lab Results  Component Value Date   TSH 0.73 06/16/2019    Therapeutic Level Labs: No results found for: LITHIUM No results found for: VALPROATE No components found for:  CBMZ  Current Medications: Current Outpatient Medications  Medication Sig Dispense Refill   . acebutolol (SECTRAL) 200 MG capsule TAKE 1 CAPSULE(200 MG) BY MOUTH DAILY 90 capsule 1  . amitriptyline (ELAVIL) 25 MG tablet TAKE 1 TABLET(25 MG) BY MOUTH AT BEDTIME 90 tablet 1  . clonazePAM (KLONOPIN) 0.5 MG tablet Take 1 tablet (0.5 mg total) by mouth at bedtime as needed (insomnia). 30 tablet 0  . HYDROmorphone (DILAUDID) 2 MG tablet Take 1 tablet (2 mg total) by mouth every 6 (six) hours as needed for severe pain. 20 tablet 0  . HYDROmorphone HCl (EXALGO) 8 MG T24A SR tablet Take 8 mg by mouth daily.    . nitrofurantoin (MACRODANTIN) 100 MG capsule TAKE 1 CAPSULE(100 MG) BY MOUTH DAILY 90 capsule 3  . olopatadine (PATANOL) 0.1 % ophthalmic solution Place 1 drop into both eyes daily. 5 mL 3  . temazepam (RESTORIL) 30 MG capsule Take 1 capsule (30 mg total) by mouth at bedtime as needed for sleep. 30 capsule 1   No current facility-administered medications for this visit.     Musculoskeletal: Strength & Muscle Tone: N/A Gait & Station: N/A Patient leans: N/A  Psychiatric Specialty Exam: Review of Systems  There were no vitals taken for this visit.There is no height or weight on file to calculate BMI.  General Appearance: {Appearance:22683}  Eye Contact:  {B[facility] EYE CONTACT:22684}  Speech:  Clear and Coherent  Volume:  Normal  Mood:  {BHH MOOD:22306}  Affect:  {Affect (PAA):22687}  Thought Process:  Coherent  Orientation:  Full (Time, Place, and Person)  Thought Content: Logical   Suicidal Thoughts:  {ST/HT (PAA):22692}  Homicidal Thoughts:  {ST/HT (PAA):22692}  Memory:  Immediate;   Good  Judgement:  {Judgement (PAA):22694}  Insight:  {Insight (PAA):22695}  Psychomotor Activity:  Normal  Concentration:  Concentration: Good and Attention Span: Good  Recall:  Good  Fund of Knowledge: Good  Language: Good  Akathisia:  No  Handed:  Right  AIMS (if indicated): not done  Assets:  Communication Skills Desire for Improvement  ADL's:  Intact  Cognition: WNL  Sleep:  {BHH  GOOD/FAIR/POOR:22877}   Screenings:   Assessment and Plan:  Christie Hurst is a 44 y.o. year old female with a history of insomnia, chronic pain syndrome,multiple back surgeries with complications,urethral spasm,congenital anomaly of the ureter, inappropriate tachycardia , who presents for follow up appointment for No diagnosis found.  # Insomnia  There has been overall improvement in insomnia as her pain improves.  Will continue clonazepam and temazepam for insomnia. Although it is preferable not to use benzodiazepine for insomnia, especially with concomitant use of opioid, will continue this medication  given the benefits outweighs risk.  There has been no concerning behavior of overusing medication.  Discussed risk of dependence and oversedation.  Noted that she does have history of adverse reaction to trials of hypnotics as described below. Discussed sleep hygiene.   # Adjustment disorder with anxiety  She reports ongoing anxiety in the context of conflict with her family in law.  Other psychosocial stressors includes chronic pain. Discussed cognitive defusion and coached cognitive restructuring. Although she will greatly benefit from ACT/CBT, she is unable to do so due to schedule conflict. Will continue to discuss as needed.    Plan  1. Continue clonazepam 0.5 mg daily as needed forinsomnia  2. Continue temazepam 15 mg at night as needed forinsomnia 3.Next appointment: in January - on buprenorphine, amitriptyline 25 mg at night(limited benefit from higher dose; prescribed by urology)  Past trials of medication:citalopram (jaw stiffness), sertraline (restless), fluoxetine (restless),duloxetine,mirtazapine (chest pain),Quetiapine- "sick,"Baclofen- crazy dreams,hydroxyzine (hyperactive),gabapentin (drowsiness),melatonin,Ambien (middle insomnia),trazodone, temazepam, clonazepam,  The patient demonstrates the following risk factors for suicide: Chronic risk factors  for suicide include:psychiatric disorder ofanxietyand chronic pain. Acute risk factorsfor suicide include: family or marital conflict and unemployment. Protective factorsfor this patient include: positive social support, responsibility to others (children, family), coping skills and hope for the future. Considering these factors, the overall suicide risk at this point appears to below. Patientisappropriate for outpatient follow up.  Norman Clay, MD 11/01/2019, 12:48 PM

## 2019-11-08 ENCOUNTER — Other Ambulatory Visit (HOSPITAL_COMMUNITY): Payer: Self-pay | Admitting: Psychiatry

## 2019-11-08 DIAGNOSIS — G4700 Insomnia, unspecified: Secondary | ICD-10-CM

## 2019-11-08 MED ORDER — CLONAZEPAM 0.5 MG PO TABS: 0.5000 mg | ORAL_TABLET | Freq: Every evening | ORAL | 0 refills | Status: DC | PRN

## 2019-11-09 ENCOUNTER — Telehealth (HOSPITAL_COMMUNITY): Payer: Self-pay | Admitting: *Deleted

## 2019-11-09 ENCOUNTER — Ambulatory Visit (HOSPITAL_COMMUNITY): Payer: 59 | Admitting: Psychiatry

## 2019-11-09 ENCOUNTER — Other Ambulatory Visit (HOSPITAL_COMMUNITY): Payer: Self-pay | Admitting: Psychiatry

## 2019-11-09 ENCOUNTER — Other Ambulatory Visit: Payer: Self-pay

## 2019-11-09 DIAGNOSIS — G4700 Insomnia, unspecified: Secondary | ICD-10-CM

## 2019-11-09 MED ORDER — TEMAZEPAM 30 MG PO CAPS: 30.0000 mg | ORAL_CAPSULE | Freq: Every evening | ORAL | 0 refills | Status: DC | PRN

## 2019-11-09 NOTE — Telephone Encounter (Signed)
Ordered temazepam (clonazepam was ordered yesterday)

## 2019-11-09 NOTE — Telephone Encounter (Signed)
SPOKE WITH PATIENT TO RESCHEDULE APPOINTMENT DUE TO PROVIDE OUT ILL.  PATIENT STATES SHE WILL NEED REFILLS ON MED'S. NEXT APPT 11/13/2019

## 2019-11-10 NOTE — Progress Notes (Signed)
Virtual Visit via Video Note  I connected with Christie Hurst on 11/13/19 at 11:30 AM EDT by a video enabled telemedicine application and verified that I am speaking with the correct person using two identifiers.   I discussed the limitations of evaluation and management by telemedicine and the availability of in person appointments. The patient expressed understanding and agreed to proceed.      I discussed the assessment and treatment plan with the patient. The patient was provided an opportunity to ask questions and all were answered. The patient agreed with the plan and demonstrated an understanding of the instructions.   The patient was advised to call back or seek an in-person evaluation if the symptoms worsen or if the condition fails to improve as anticipated.  I provided 15 minutes of non-face-to-face time during this encounter.   [name], MD    [facility] MD/PA/NP OP Progress Note  11/13/2019 12:06 PM Christie Hurst  MRN:  [id number]  Chief Complaint:  Chief Complaint    Follow-up; Other     HPI:  - temazepam was uptitrated at the visit with DR. Evelene Croon This is a follow-up appointment for insomnia. She was able to sleep 7 hours after uptitration of temazepam until she started to have worsening in pain. She states that she had worsening in pain, and had to be on prednisone after she began exercise several weeks ago. She used electrical machine, 20 mins per day. Although she feels frustrated about this, she would "keep trying." She is not thinking of work or school at this time. She visited her family in law in [place] during holidays. She states that it went fine (her family was in the room next to her while doing this visit). She has decided not to go there with her family this summer. She takes clonazepam 0.25-0.5 mg per day. She enjoys meeting with her friend. She denies feeling depressed.  She denies panic attacks. She denies SI.    Visit Diagnosis:    ICD-10-CM   1.  Insomnia, unspecified type  G47.00     Past Psychiatric History: Please see initial evaluation for full details. I have reviewed the history. No updates at this time.     Past Medical History:  Past Medical History:  Diagnosis Date  . Chronic back pain   . Frequent UTI    d/t congential abnormality   . History of DVT (deep vein thrombosis) 05/2013  . Inappropriate sinus tachycardia   . Phlebitis   . POTS (postural orthostatic tachycardia syndrome)   . Tachycardia   . Urethral spasm   . UTI (urinary tract infection)     Past Surgical History:  Procedure Laterality Date  . CESAREAN SECTION     x 2   . LAMINECTOMY  12/2012   L5/S1  . microdiscetomy  11/2012   L5/S1  . SPINAL FUSION  03/2013   L5/S1  . stent/DVT  05/2013   Left groin    Family Psychiatric History: Please see initial evaluation for full details. I have reviewed the history. No updates at this time.     Family History:  Family History  Problem Relation Age of Onset  . Early death Mother 58  . Transient ischemic attack Mother   . Depression Mother   . Mental illness Mother   . Transient ischemic attack Father   . Breast cancer Paternal Grandmother   . Breast cancer Paternal Aunt   . Breast cancer Paternal Aunt  Social History:  Social History   Socioeconomic History  . Marital status: Married    Spouse name: jamie  . Number of children: 2  . Years of education: Not on file  . Highest education level: Bachelor's degree (e.g., BA, AB, BS)  Occupational History  . Not on file  Tobacco Use  . Smoking status: Never Smoker  . Smokeless tobacco: Never Used  Substance and Sexual Activity  . Alcohol use: Never  . Drug use: Never  . Sexual activity: Yes  Other Topics Concern  . 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 Difficulty of Paying Living  Expenses:   Food Insecurity:   . Worried About Programme researcher, broadcasting/film/video in the Last Year:   . Barista in the Last Year:   Transportation Needs:   . Freight forwarder (Medical):   Marland Kitchen Lack of Transportation (Non-Medical):   Physical Activity:   . Days of Exercise per Week:   . Minutes of Exercise per Session:   Stress:   . Feeling of Stress :   Social Connections:   . Frequency of Communication with Friends and Family:   . Frequency of Social Gatherings with Friends and Family:   . Attends Religious Services:   . Active Member of Clubs or Organizations:   . Attends Banker Meetings:   Marland Kitchen Marital Status:     Allergies:  Allergies  Allergen Reactions  . Tamsulosin Hypertension, Photosensitivity, Rash and Shortness Of Breath  . Tramadol Anxiety, Palpitations and Shortness Of Breath  . Cephalexin Hives  . Duloxetine Hcl Other (See Comments)  . Lamotrigine Photosensitivity  . Levorphanol   . Remeron [Mirtazapine]     Chest pain  . Seroquel [Quetiapine Fumarate]     "out of it" it opposite of what it was suppose to do  . Buprenorphine Hcl Anxiety and Palpitations  . Sulfamethoxazole-Trimethoprim Hives and Palpitations    Metabolic Disorder Labs: Lab Results  Component Value Date   HGBA1C 5.2 06/16/2019   No results found for: PROLACTIN Lab Results  Component Value Date   CHOL 224 (H) 06/16/2019   TRIG 118.0 06/16/2019   HDL 64.50 06/16/2019   CHOLHDL 3 06/16/2019   VLDL 23.6 06/16/2019   LDLCALC 136 (H) 06/16/2019   Lab Results  Component Value Date   TSH 0.73 06/16/2019    Therapeutic Level Labs: No results found for: LITHIUM No results found for: VALPROATE No components found for:  CBMZ  Current Medications: Current Outpatient Medications  Medication Sig Dispense Refill  . acebutolol (SECTRAL) 200 MG capsule TAKE 1 CAPSULE(200 MG) BY MOUTH DAILY 90 capsule 1  . amitriptyline (ELAVIL) 25 MG tablet TAKE 1 TABLET(25 MG) BY MOUTH AT BEDTIME  90 tablet 1  . clonazePAM (KLONOPIN) 0.5 MG tablet Take 1 tablet (0.5 mg total) by mouth at bedtime as needed (insomnia). 30 tablet 0  . HYDROmorphone (DILAUDID) 2 MG tablet Take 1 tablet (2 mg total) by mouth every 6 (six) hours as needed for severe pain. 20 tablet 0  . HYDROmorphone HCl (EXALGO) 8 MG T24A SR tablet Take 8 mg by mouth daily.    . nitrofurantoin (MACRODANTIN) 100 MG capsule TAKE 1 CAPSULE(100 MG) BY MOUTH DAILY 90 capsule 3  . olopatadine (PATANOL) 0.1 % ophthalmic  solution Place 1 drop into both eyes daily. 5 mL 3  . temazepam (RESTORIL) 30 MG capsule Take 1 capsule (30 mg total) by mouth at bedtime as needed for sleep. 30 capsule 0   No current facility-administered medications for this visit.     Musculoskeletal: Strength & Muscle Tone: N/A Gait & Station: N/A Patient leans: N/A  Psychiatric Specialty Exam: Review of Systems  Psychiatric/Behavioral: Positive for sleep disturbance. Negative for agitation, behavioral problems, confusion, decreased concentration, dysphoric mood, hallucinations, self-injury and suicidal ideas. The patient is not nervous/anxious and is not hyperactive.   All other systems reviewed and are negative.   There were no vitals taken for this visit.There is no height or weight on file to calculate BMI.  General Appearance: Fairly Groomed  Eye Contact:  Good  Speech:  Clear and Coherent  Volume:  Normal  Mood:  "good:  Affect:  Appropriate, Congruent and euthymic  Thought Process:  Coherent  Orientation:  Full (Time, Place, and Person)  Thought Content: Logical   Suicidal Thoughts:  No  Homicidal Thoughts:  No  Memory:  Immediate;   Good  Judgement:  Good  Insight:  Good  Psychomotor Activity:  Normal  Concentration:  Concentration: Good and Attention Span: Good  Recall:  Good  Fund of Knowledge: Good  Language: Good  Akathisia:  No  Handed:  Right  AIMS (if indicated): not done  Assets:  Communication Skills Desire for  Improvement  ADL's:  Intact  Cognition: WNL  Sleep:  Fair   Screenings:   Assessment and Plan:  Christie Hurst is a 44 y.o. year old female with a history of insomnia, chronic pain syndrome, multiple back surgeries with complications,urethral spasm,congenital anomaly of the ureter, inappropriate tachycardia   , who presents for follow up appointment for Insomnia, unspecified type  # Insomnia There has been significant improvement in insomnia since uptitration of temazepam except that she had insomnia when she had worsening in physical pain.  We will continue clonazepam and melatonin for insomnia.  Although it is preferable to do monotherapy, will continue this regimen given benefits outweighs risk. She did have adverse reaction to hypnotic as described below.  Discussed risk of dependence and oversedation, especially with concomitant use of opioid.    # Adjustment disorder with anxiety It has been improving.  Psychosocial stressors includes conflict with her family in law, and chronic pain.  Other she would greatly benefit from ACT/CBT, she is not interested in this option.   Plan I have reviewed and updated plans as below 1. Continue clonazepam 0.25-0.5 mg daily as needed forinsomnia  2. Continue temazepam 30 mg at night as needed forinsomnia 3.Next appointment:  6/21 at 9:10 for 20 mins, video - on amitriptyline 25 mg at night(limited benefit from higher dose; prescribed by urology) - on hydromorphone 2 mg prn   Past trials of medication:citalopram (jaw stiffness), sertraline (restless), fluoxetine (restless),duloxetine,mirtazapine (chest pain),Quetiapine- "sick,"Baclofen- crazy dreams,hydroxyzine (hyperactive),gabapentin (drowsiness),melatonin,Ambien (middle insomnia),trazodone, temazepam, clonazepam,  The patient demonstrates the following risk factors for suicide: Chronic risk factors for suicide include:psychiatric disorder ofanxietyand chronic pain. Acute  risk factorsfor suicide include: family or marital conflict and unemployment. Protective factorsfor this patient include: positive social support, responsibility to others (children, family), coping skills and hope for the future. Considering these factors, the overall suicide risk at this point appears to below. Patientisappropriate for outpatient follow up.   Norman Clay, MD 11/13/2019, 12:06 PM

## 2019-11-13 ENCOUNTER — Encounter (HOSPITAL_COMMUNITY): Payer: Self-pay | Admitting: Psychiatry

## 2019-11-13 ENCOUNTER — Other Ambulatory Visit: Payer: Self-pay

## 2019-11-13 ENCOUNTER — Ambulatory Visit (INDEPENDENT_AMBULATORY_CARE_PROVIDER_SITE_OTHER): Payer: 59 | Admitting: Psychiatry

## 2019-11-13 DIAGNOSIS — G4700 Insomnia, unspecified: Secondary | ICD-10-CM | POA: Diagnosis not present

## 2019-11-13 NOTE — Patient Instructions (Signed)
1. Continue clonazepam 0.25-0.5 mg daily as needed forinsomnia  2. Continue temazepam 30 mg at night as needed forinsomnia 3.Next appointment:  6/21 at 9:10

## 2019-11-14 ENCOUNTER — Other Ambulatory Visit: Payer: Self-pay | Admitting: Adult Health

## 2019-11-14 ENCOUNTER — Encounter: Payer: Self-pay | Admitting: Adult Health

## 2019-11-14 DIAGNOSIS — R000 Tachycardia, unspecified: Secondary | ICD-10-CM

## 2019-11-14 NOTE — Telephone Encounter (Signed)
Received this message from Shirline Frees, [place], NP  You 7 minutes ago (3:47 PM)   Ok to send in for 6 months    Message text

## 2019-12-08 ENCOUNTER — Other Ambulatory Visit (HOSPITAL_COMMUNITY): Payer: Self-pay | Admitting: Psychiatry

## 2019-12-08 ENCOUNTER — Telehealth (HOSPITAL_COMMUNITY): Payer: Self-pay | Admitting: *Deleted

## 2019-12-08 DIAGNOSIS — G4700 Insomnia, unspecified: Secondary | ICD-10-CM

## 2019-12-08 MED ORDER — TEMAZEPAM 30 MG PO CAPS: 30.0000 mg | ORAL_CAPSULE | Freq: Every evening | ORAL | 1 refills | Status: DC | PRN

## 2019-12-08 NOTE — Telephone Encounter (Signed)
[facility] FAXED CONTROLLED SUBSTANCE REFILL REQUEST temazepam (RESTORIL) 30 MG capsule

## 2019-12-08 NOTE — Telephone Encounter (Signed)
Ordered

## 2019-12-28 ENCOUNTER — Other Ambulatory Visit: Payer: Self-pay | Admitting: Adult Health

## 2019-12-28 NOTE — Telephone Encounter (Signed)
Sent to the pharmacy by e-scribe. 

## 2020-01-08 ENCOUNTER — Other Ambulatory Visit (HOSPITAL_COMMUNITY): Payer: Self-pay | Admitting: Psychiatry

## 2020-01-08 DIAGNOSIS — G4700 Insomnia, unspecified: Secondary | ICD-10-CM

## 2020-01-08 MED ORDER — CLONAZEPAM 0.5 MG PO TABS: 0.5000 mg | ORAL_TABLET | Freq: Every evening | ORAL | 0 refills | Status: DC | PRN

## 2020-01-30 NOTE — Progress Notes (Signed)
Virtual Visit via Video Note  I connected with Christie Hurst on 02/05/20 at  9:10 AM EDT by a video enabled telemedicine application and verified that I am speaking with the correct person using two identifiers.   I discussed the limitations of evaluation and management by telemedicine and the availability of in person appointments. The patient expressed understanding and agreed to proceed.     I discussed the assessment and treatment plan with the patient. The patient was provided an opportunity to ask questions and all were answered. The patient agreed with the plan and demonstrated an understanding of the instructions.   The patient was advised to call back or seek an in-person evaluation if the symptoms worsen or if the condition fails to improve as anticipated.  Location: patient- home, provider- office   I provided 10 minutes of non-face-to-face time during this encounter.   Norman Clay, MD    [facility] MD/PA/NP OP Progress Note  02/05/2020 9:32 AM Christie Hurst  MRN:  [id number]  Chief Complaint:  Chief Complaint    Follow-up; Other     HPI:  This is a follow-up appointment for insomnia.  She states that she has been doing very well until recently.  She started to have insomnia again.  She sleeps on 11:00 as she needs to wait for her daughter, who comes home late at night.  She has middle insomnia.  It occurred a few days ago without any reason, and another occurred due to leg pain. She is considering to take prednisone for a short term, which was instructed by her provider for flare up of her symptoms.  She usually has insomnia when she takes prednisone.  She tries not to overdo things as it causes worsening in leg pain, although she wants to be active. She does regular stretch and gets sunlight during the day. She denies any caffeine intake, or any change in her lifestyle. She feels comfortable staying on the same medication regimen.  She denies feeling depressed. She has good  appetite. She denies anxiety.  She denies SI.   01/08/2020   Visit Diagnosis:    ICD-10-CM   1. Insomnia, unspecified type  G47.00 clonazePAM (KLONOPIN) 0.5 MG tablet    temazepam (RESTORIL) 30 MG capsule    Past Psychiatric History: Please see initial evaluation for full details. I have reviewed the history. No updates at this time.     Past Medical History:  Past Medical History:  Diagnosis Date  . Chronic back pain   . Frequent UTI    d/t congential abnormality   . History of DVT (deep vein thrombosis) 05/2013  . Inappropriate sinus tachycardia   . Phlebitis   . POTS (postural orthostatic tachycardia syndrome)   . Tachycardia   . Urethral spasm   . UTI (urinary tract infection)     Past Surgical History:  Procedure Laterality Date  . CESAREAN SECTION     x 2   . LAMINECTOMY  12/2012   L5/S1  . microdiscetomy  11/2012   L5/S1  . SPINAL FUSION  03/2013   L5/S1  . stent/DVT  05/2013   Left groin    Family Psychiatric History: Please see initial evaluation for full details. I have reviewed the history. No updates at this time.     Family History:  Family History  Problem Relation Age of Onset  . Early death Mother 39  . Transient ischemic attack Mother   . Depression Mother   . Mental illness  Mother   . Transient ischemic attack Father   . Breast cancer Paternal Grandmother   . Breast cancer Paternal Aunt   . Breast cancer Paternal Aunt     Social History:  Social History   Socioeconomic History  . Marital status: Married    Spouse name: jamie  . Number of children: 2  . Years of education: Not on file  . Highest education level: Bachelor's degree (e.g., BA, AB, BS)  Occupational History  . Not on file  Tobacco Use  . Smoking status: Never Smoker  . Smokeless tobacco: Never Used  Vaping Use  . Vaping Use: Never used  Substance and Sexual Activity  . Alcohol use: Never  . Drug use: Never  . Sexual activity: Yes  Other Topics Concern  . 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 Difficulty of Paying Living Expenses:   Food Insecurity:   . Worried About Programme researcher, broadcasting/film/video in the Last Year:   . Barista in the Last Year:   Transportation Needs:   . Freight forwarder (Medical):   Marland Kitchen Lack of Transportation (Non-Medical):   Physical Activity:   . Days of Exercise per Week:   . Minutes of Exercise per Session:   Stress:   . Feeling of Stress :   Social Connections:   . Frequency of Communication with Friends and Family:   . Frequency of Social Gatherings with Friends and Family:   . Attends Religious Services:   . Active Member of Clubs or Organizations:   . Attends Banker Meetings:   Marland Kitchen Marital Status:     Allergies:  Allergies  Allergen Reactions  . Tamsulosin Hypertension, Photosensitivity, Rash and Shortness Of Breath  . Tramadol Anxiety, Palpitations and Shortness Of Breath  . Cephalexin Hives  . Duloxetine Hcl Other (See Comments)  . Lamotrigine Photosensitivity  . Levorphanol   . Remeron [Mirtazapine]     Chest pain  . Seroquel [Quetiapine Fumarate]     "out of it" it opposite of what it was suppose to do  . Buprenorphine Hcl Anxiety and Palpitations  . Sulfamethoxazole-Trimethoprim Hives and Palpitations    Metabolic Disorder Labs: Lab Results  Component Value Date   HGBA1C 5.2 06/16/2019   No results found for: PROLACTIN Lab Results  Component Value Date   CHOL 224 (H) 06/16/2019   TRIG 118.0 06/16/2019   HDL 64.50 06/16/2019   CHOLHDL 3 06/16/2019   VLDL 23.6 06/16/2019   LDLCALC 136 (H) 06/16/2019   Lab Results  Component Value Date   TSH 0.73 06/16/2019    Therapeutic Level Labs: No results found for: LITHIUM No results found for: VALPROATE No components found for:  CBMZ  Current Medications: Current Outpatient  Medications  Medication Sig Dispense Refill  . acebutolol (SECTRAL) 200 MG capsule TAKE 1 CAPSULE(200 MG) BY MOUTH DAILY 90 capsule 1  . amitriptyline (ELAVIL) 25 MG tablet TAKE 1 TABLET(25 MG) BY MOUTH AT BEDTIME 90 tablet 1  . clonazePAM (KLONOPIN) 0.5 MG tablet Take 1 tablet (0.5 mg total) by mouth at bedtime as needed (insomnia). 30 tablet 2  . HYDROmorphone (DILAUDID) 2 MG tablet Take 1 tablet (2 mg total) by mouth every 6 (six) hours as needed for severe pain. 20 tablet 0  .  HYDROmorphone HCl (EXALGO) 8 MG T24A SR tablet Take 8 mg by mouth daily.    . nitrofurantoin (MACRODANTIN) 100 MG capsule TAKE 1 CAPSULE(100 MG) BY MOUTH DAILY 90 capsule 3  . olopatadine (PATANOL) 0.1 % ophthalmic solution Place 1 drop into both eyes daily. 5 mL 3  . temazepam (RESTORIL) 30 MG capsule Take 1 capsule (30 mg total) by mouth at bedtime as needed for sleep. 30 capsule 2   No current facility-administered medications for this visit.     Musculoskeletal: Strength & Muscle Tone: N/A Gait & Station: N/A Patient leans: N/A  Psychiatric Specialty Exam: Review of Systems  Psychiatric/Behavioral: Positive for sleep disturbance. Negative for agitation, behavioral problems, confusion, decreased concentration, dysphoric mood, hallucinations, self-injury and suicidal ideas. The patient is not nervous/anxious and is not hyperactive.     There were no vitals taken for this visit.There is no height or weight on file to calculate BMI.  General Appearance: Fairly Groomed  Eye Contact:  Good  Speech:  Clear and Coherent  Volume:  Normal  Mood:  good  Affect:  Appropriate, Congruent and Full Range  Thought Process:  Coherent  Orientation:  Full (Time, Place, and Person)  Thought Content: Logical   Suicidal Thoughts:  No  Homicidal Thoughts:  No  Memory:  Immediate;   Good  Judgement:  Good  Insight:  Good  Psychomotor Activity:  Normal  Concentration:  Concentration: Good and Attention Span: Good   Recall:  Good  Fund of Knowledge: Good  Language: Good  Akathisia:  No  Handed:  Right  AIMS (if indicated): not done  Assets:  Communication Skills Desire for Improvement  ADL's:  Intact  Cognition: WNL  Sleep:  Poor   Screenings:   Assessment and Plan:  Christie Hurst is a 44 y.o. year old female with a history of  insomnia, chronic pain syndrome, multiple back surgeries with complications,urethral spasm,congenital anomaly of the ureter, inappropriate tachycardia, who presents for follow up appointment for below.   1. Insomnia, unspecified type Although she reports recent worsening in insomnia, she is amenable to stay on current medication regimen.  Discussed sleep hygiene.  Will continue clonazepam and temazepam for insomnia.  Noted that although it is preferable to monotherapy, benefits outweighs risk given her past trials of medication which led to adverse reaction.  She is aware of the risk of dependence and oversedation, especially with concomitant use of opioid.   Plan I have reviewed and updated plans as below 1. Continue clonazepam 0.25-0.5 mg daily as needed forinsomnia  2. Continue temazepam 30 mg at night as needed forinsomnia 3.Next appointment:  9/13 at 10:20 for 20 mins, video - on amitriptyline 25 mg at night(limited benefit from higher dose; prescribed by urology) - on hydromorphone 2 mg prn   Past trials of medication:citalopram (jaw stiffness), sertraline (restless), fluoxetine (restless),duloxetine,mirtazapine (chest pain),Quetiapine- "sick,"Baclofen- crazy dreams,hydroxyzine (hyperactive),gabapentin (drowsiness),melatonin,Ambien (middle insomnia),trazodone, temazepam, clonazepam,  The patient demonstrates the following risk factors for suicide: Chronic risk factors for suicide include:psychiatric disorder ofanxietyand chronic pain. Acute risk factorsfor suicide include: family or marital conflict and unemployment. Protective  factorsfor this patient include: positive social support, responsibility to others (children, family), coping skills and hope for the future. Considering these factors, the overall suicide risk at this point appears to below. Patientisappropriate for outpatient follow up.  [name], MD 02/05/2020, 9:32 AM

## 2020-02-05 ENCOUNTER — Telehealth (INDEPENDENT_AMBULATORY_CARE_PROVIDER_SITE_OTHER): Payer: 59 | Admitting: Psychiatry

## 2020-02-05 ENCOUNTER — Other Ambulatory Visit: Payer: Self-pay

## 2020-02-05 ENCOUNTER — Encounter (HOSPITAL_COMMUNITY): Payer: Self-pay | Admitting: Psychiatry

## 2020-02-05 DIAGNOSIS — M5442 Lumbago with sciatica, left side: Secondary | ICD-10-CM | POA: Diagnosis not present

## 2020-02-05 DIAGNOSIS — R000 Tachycardia, unspecified: Secondary | ICD-10-CM | POA: Diagnosis not present

## 2020-02-05 DIAGNOSIS — N390 Urinary tract infection, site not specified: Secondary | ICD-10-CM

## 2020-02-05 DIAGNOSIS — G8929 Other chronic pain: Secondary | ICD-10-CM | POA: Diagnosis not present

## 2020-02-05 DIAGNOSIS — G4700 Insomnia, unspecified: Secondary | ICD-10-CM

## 2020-02-05 DIAGNOSIS — N3289 Other specified disorders of bladder: Secondary | ICD-10-CM

## 2020-02-05 MED ORDER — TEMAZEPAM 30 MG PO CAPS: 30.0000 mg | ORAL_CAPSULE | Freq: Every evening | ORAL | 2 refills | Status: DC | PRN

## 2020-02-05 MED ORDER — CLONAZEPAM 0.5 MG PO TABS: 0.5000 mg | ORAL_TABLET | Freq: Every evening | ORAL | 2 refills | Status: DC | PRN

## 2020-02-05 NOTE — Patient Instructions (Signed)
1. Continue clonazepam 0.25-0.5 mg daily as needed forinsomnia  2. Continue temazepam 30 mg at night as needed forinsomnia 3.Next appointment:  9/13 at 10:20

## 2020-03-22 ENCOUNTER — Encounter: Payer: Self-pay | Admitting: Adult Health

## 2020-03-24 ENCOUNTER — Other Ambulatory Visit: Payer: Self-pay | Admitting: Adult Health

## 2020-03-25 ENCOUNTER — Encounter: Payer: Self-pay | Admitting: Adult Health

## 2020-03-25 MED ORDER — AMITRIPTYLINE HCL 25 MG PO TABS: ORAL_TABLET | ORAL | 0 refills | Status: DC

## 2020-03-25 MED ORDER — NITROFURANTOIN MACROCRYSTAL 100 MG PO CAPS: ORAL_CAPSULE | ORAL | 0 refills | Status: DC

## 2020-03-25 NOTE — Telephone Encounter (Signed)
Ok to fill each for 90 days

## 2020-04-03 ENCOUNTER — Encounter: Payer: Self-pay | Admitting: Adult Health

## 2020-04-04 ENCOUNTER — Other Ambulatory Visit: Payer: Self-pay | Admitting: Adult Health

## 2020-04-04 MED ORDER — METHYLPREDNISOLONE 4 MG PO TBPK: ORAL_TABLET | ORAL | 0 refills | Status: DC

## 2020-04-12 ENCOUNTER — Other Ambulatory Visit: Payer: Self-pay | Admitting: Adult Health

## 2020-04-12 ENCOUNTER — Encounter: Payer: Self-pay | Admitting: Adult Health

## 2020-04-12 MED ORDER — PREDNISONE 10 MG PO TABS: ORAL_TABLET | ORAL | 0 refills | Status: DC

## 2020-04-12 NOTE — Telephone Encounter (Signed)
Please advise 

## 2020-04-24 NOTE — Progress Notes (Deleted)
[facility] MD/PA/NP OP Progress Note  04/24/2020 9:05 AM Christie Hurst  MRN:  [id number]  Chief Complaint:  HPI:   Steroid taper, pain  Visit Diagnosis: No diagnosis found.  Past Psychiatric History: Please see initial evaluation for full details. I have reviewed the history. No updates at this time.     Past Medical History:  Past Medical History:  Diagnosis Date  . Chronic back pain   . Frequent UTI    d/t congential abnormality   . History of DVT (deep vein thrombosis) 05/2013  . Inappropriate sinus tachycardia   . Phlebitis   . POTS (postural orthostatic tachycardia syndrome)   . Tachycardia   . Urethral spasm   . UTI (urinary tract infection)     Past Surgical History:  Procedure Laterality Date  . CESAREAN SECTION     x 2   . LAMINECTOMY  12/2012   L5/S1  . microdiscetomy  11/2012   L5/S1  . SPINAL FUSION  03/2013   L5/S1  . stent/DVT  05/2013   Left groin    Family Psychiatric History: Please see initial evaluation for full details. I have reviewed the history. No updates at this time.     Family History:  Family History  Problem Relation Age of Onset  . Early death Mother 62  . Transient ischemic attack Mother   . Depression Mother   . Mental illness Mother   . Transient ischemic attack Father   . Breast cancer Paternal Grandmother   . Breast cancer Paternal Aunt   . Breast cancer Paternal Aunt     Social History:  Social History   Socioeconomic History  . Marital status: Married    Spouse name: jamie  . Number of children: 2  . Years of education: Not on file  . Highest education level: Bachelor's degree (e.g., BA, AB, BS)  Occupational History  . Not on file  Tobacco Use  . Smoking status: Never Smoker  . Smokeless tobacco: Never Used  Vaping Use  . Vaping Use: Never used  Substance and Sexual Activity  . Alcohol use: Never  . Drug use: Never  . Sexual activity: Yes  Other Topics Concern  . 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 Difficulty of Paying Living Expenses: Not on file  Food Insecurity:   . Worried About Programme researcher, broadcasting/film/video in the Last Year: Not on file  . Ran Out of Food in the Last Year: Not on file  Transportation Needs:   . Lack of Transportation (Medical): Not on file  . Lack of Transportation (Non-Medical): Not on file  Physical Activity:   . Days of Exercise per Week: Not on file  . Minutes of Exercise per Session: Not on file  Stress:   . Feeling of Stress : Not on file  Social Connections:   . Frequency of Communication with Friends and Family: Not on file  . Frequency of Social Gatherings with Friends and Family: Not on file  . Attends Religious Services: Not on file  . Active Member of Clubs or Organizations: Not on file  . Attends Banker Meetings: Not on file  . Marital Status: Not on file    Allergies:  Allergies  Allergen Reactions  . Tamsulosin Hypertension, Photosensitivity, Rash  and Shortness Of Breath  . Tramadol Anxiety, Palpitations and Shortness Of Breath  . Cephalexin Hives  . Duloxetine Hcl Other (See Comments)  . Lamotrigine Photosensitivity  . Levorphanol   . Remeron [Mirtazapine]     Chest pain  . Seroquel [Quetiapine Fumarate]     "out of it" it opposite of what it was suppose to do  . Buprenorphine Hcl Anxiety and Palpitations  . Sulfamethoxazole-Trimethoprim Hives and Palpitations    Metabolic Disorder Labs: Lab Results  Component Value Date   HGBA1C 5.2 06/16/2019   No results found for: PROLACTIN Lab Results  Component Value Date   CHOL 224 (H) 06/16/2019   TRIG 118.0 06/16/2019   HDL 64.50 06/16/2019   CHOLHDL 3 06/16/2019   VLDL 23.6 06/16/2019   LDLCALC 136 (H) 06/16/2019   Lab Results  Component Value Date   TSH 0.73 06/16/2019    Therapeutic Level Labs: No results found for:  LITHIUM No results found for: VALPROATE No components found for:  CBMZ  Current Medications: Current Outpatient Medications  Medication Sig Dispense Refill  . acebutolol (SECTRAL) 200 MG capsule TAKE 1 CAPSULE(200 MG) BY MOUTH DAILY 90 capsule 1  . amitriptyline (ELAVIL) 25 MG tablet TAKE 1 TABLET BY MOUTH AT BEDTIME 90 tablet 0  . clonazePAM (KLONOPIN) 0.5 MG tablet Take 1 tablet (0.5 mg total) by mouth at bedtime as needed (insomnia). 30 tablet 2  . HYDROmorphone (DILAUDID) 2 MG tablet Take 1 tablet (2 mg total) by mouth every 6 (six) hours as needed for severe pain. 20 tablet 0  . HYDROmorphone HCl (EXALGO) 8 MG T24A SR tablet Take 8 mg by mouth daily.    . nitrofurantoin (MACRODANTIN) 100 MG capsule TAKE 1 CAPSULE(100 MG) BY MOUTH DAILY 90 capsule 0  . olopatadine (PATANOL) 0.1 % ophthalmic solution Place 1 drop into both eyes daily. 5 mL 3  . predniSONE (DELTASONE) 10 MG tablet 40 mg x 3 days, 20 mg x 3 days, 10 mg x 3 days 21 tablet 0  . temazepam (RESTORIL) 30 MG capsule Take 1 capsule (30 mg total) by mouth at bedtime as needed for sleep. 30 capsule 2   No current facility-administered medications for this visit.     Musculoskeletal: Strength & Muscle Tone: N/A Gait & Station: N/ A Patient leans: N/A  Psychiatric Specialty Exam: Review of Systems  There were no vitals taken for this visit.There is no height or weight on file to calculate BMI.  General Appearance: {Appearance:22683}  Eye Contact:  {[facility] EYE CONTACT:22684}  Speech:  Clear and Coherent  Volume:  Normal  Mood:  {B[facility] MOOD:22306}  Affect:  {Affect (PAA):22687}  Thought Process:  Coherent  Orientation:  Full (Time, Place, and Person)  Thought Content: Logical   Suicidal Thoughts:  {ST/HT (PAA):22692}  Homicidal Thoughts:  {ST/HT (PAA):22692}  Memory:  Immediate;   Good  Judgement:  {Judgement (PAA):22694}  Insight:  {Insight (PAA):22695}  Psychomotor Activity:  Normal  Concentration:  Concentration: Good  and Attention Span: Good  Recall:  Good  Fund of Knowledge: Good  Language: Good  Akathisia:  No  Handed:  Right  AIMS (if indicated): not done  Assets:  Communication Skills Desire for Improvement  ADL's:  Intact  Cognition: WNL  Sleep:  {[facility] GOOD/FAIR/POOR:22877}   Screenings:   Assessment and Plan:  LEYA PAIGE is a 44 y.o. year old female with a history of  insomnia, chronic pain syndrome,multiple back surgeries with complications,urethral spasm,congenital anomaly of the ureter,  inappropriate tachycardia, who presents for follow up appointment for below.     1. Insomnia, unspecified type Although she reports recent worsening in insomnia, she is amenable to stay on current medication regimen.  Discussed sleep hygiene.  Will continue clonazepam and temazepam for insomnia.  Noted that although it is preferable to monotherapy, benefits outweighs risk given her past trials of medication which led to adverse reaction.  She is aware of the risk of dependence and oversedation, especially with concomitant use of opioid.   Plan I have reviewed and updated plans as below 1. Continue clonazepam 0.25-0.5mg  daily as needed forinsomnia  2. Continue temazepam30mg  at night as needed forinsomnia 3.Next appointment:9/13 at 10:20 for 20 mins, video - on amitriptyline 25 mg at night(limited benefit from higher dose; prescribed by urology) - on hydromorphone 2 mg prn  Past trials of medication:citalopram (jaw stiffness), sertraline (restless), fluoxetine (restless),duloxetine,mirtazapine (chest pain),Quetiapine- "sick,"Baclofen- crazy dreams,hydroxyzine (hyperactive),gabapentin (drowsiness),melatonin,Ambien (middle insomnia),trazodone, temazepam, clonazepam,  The patient demonstrates the following risk factors for suicide: Chronic risk factors for suicide include:psychiatric disorder ofanxietyand chronic pain. Acute risk factorsfor suicide include: family or  marital conflict and unemployment. Protective factorsfor this patient include: positive social support, responsibility to others (children, family), coping skills and hope for the future. Considering these factors, the overall suicide risk at this point appears to below. Patientisappropriate for outpatient follow up.   [name], MD 04/24/2020, 9:05 AM

## 2020-04-29 ENCOUNTER — Telehealth (HOSPITAL_COMMUNITY): Payer: Self-pay | Admitting: Psychiatry

## 2020-04-29 ENCOUNTER — Telehealth (HOSPITAL_COMMUNITY): Payer: 59 | Admitting: Psychiatry

## 2020-04-29 ENCOUNTER — Other Ambulatory Visit: Payer: Self-pay

## 2020-04-29 NOTE — Telephone Encounter (Signed)
Sent link for video visit through Epic. Patient did not sign in. Called the patient  for appointment scheduled today. The patient did not answer the phone. Left voice message to contact the office.  

## 2020-05-09 ENCOUNTER — Other Ambulatory Visit (HOSPITAL_COMMUNITY): Payer: Self-pay | Admitting: Psychiatry

## 2020-05-09 ENCOUNTER — Telehealth (HOSPITAL_COMMUNITY): Payer: Self-pay

## 2020-05-09 DIAGNOSIS — G4700 Insomnia, unspecified: Secondary | ICD-10-CM

## 2020-05-09 MED ORDER — CLONAZEPAM 0.5 MG PO TABS: 0.5000 mg | ORAL_TABLET | Freq: Every evening | ORAL | 0 refills | Status: DC | PRN

## 2020-05-09 NOTE — Telephone Encounter (Signed)
sent 

## 2020-05-09 NOTE — Telephone Encounter (Signed)
Medication refill request - Fax received from pt's [facility] requesting a refill of her Temazepam, last ordered 02/05/20 + 2 refills and last filled 04/07/20.  Pt has next appt scheduled for 05/27/20 with Dr. Vanetta Shawl.

## 2020-05-10 ENCOUNTER — Telehealth: Payer: Self-pay

## 2020-05-10 ENCOUNTER — Telehealth (HOSPITAL_COMMUNITY): Payer: Self-pay

## 2020-05-10 ENCOUNTER — Other Ambulatory Visit (HOSPITAL_COMMUNITY): Payer: Self-pay | Admitting: Psychiatry

## 2020-05-10 ENCOUNTER — Encounter: Payer: Self-pay | Admitting: Adult Health

## 2020-05-10 DIAGNOSIS — G4700 Insomnia, unspecified: Secondary | ICD-10-CM

## 2020-05-10 MED ORDER — TEMAZEPAM 30 MG PO CAPS: 30.0000 mg | ORAL_CAPSULE | Freq: Every evening | ORAL | 2 refills | Status: DC | PRN

## 2020-05-10 NOTE — Telephone Encounter (Signed)
Pt would like to get her physical scheduled, can you guys help her? Thank you!

## 2020-05-10 NOTE — Telephone Encounter (Signed)
sent 

## 2020-05-10 NOTE — Telephone Encounter (Signed)
Medication management - Telephone message from patient stating she still has not received a new Temazepam order. last filled 02/05/20 + 2 refills. Requests one time 0/11/21.until next appt.05/27/20.

## 2020-05-10 NOTE — Telephone Encounter (Signed)
Medication management - Called patient to inform Dr. Tenny Craw had sent in her requested refill for Temazepam.

## 2020-05-11 ENCOUNTER — Encounter: Payer: Self-pay | Admitting: Family Medicine

## 2020-05-11 ENCOUNTER — Telehealth (INDEPENDENT_AMBULATORY_CARE_PROVIDER_SITE_OTHER): Payer: 59 | Admitting: Family Medicine

## 2020-05-11 ENCOUNTER — Other Ambulatory Visit: Payer: Self-pay

## 2020-05-11 DIAGNOSIS — N898 Other specified noninflammatory disorders of vagina: Secondary | ICD-10-CM

## 2020-05-11 MED ORDER — METRONIDAZOLE 0.75 % VA GEL: 1.0000 | Freq: Two times a day (BID) | VAGINAL | 0 refills | Status: AC

## 2020-05-11 NOTE — Assessment & Plan Note (Signed)
Suspect BV as the cause given symptoms and prior history.  We will treat with metronidazole gel.  If not improving with this she will let her primary care office know.

## 2020-05-11 NOTE — Progress Notes (Signed)
Virtual Visit via telephone Note  This visit type was conducted due to national recommendations for restrictions regarding the COVID-19 pandemic (e.g. social distancing).  This format is felt to be most appropriate for this patient at this time.  All issues noted in this document were discussed and addressed.  No physical exam was performed (except for noted visual exam findings with Video Visits).   I connected with Christie Hurst today at  9:40 AM EDT by telephone and verified that I am speaking with the correct person using two identifiers. Location patient: home Location provider: work Persons participating in the virtual visit: patient, provider  I discussed the limitations, risks, security and privacy concerns of performing an evaluation and management service by telephone and the availability of in person appointments. I also discussed with the patient that there may be a patient responsible charge related to this service. The patient expressed understanding and agreed to proceed.  Interactive audio and video telecommunications were attempted between this provider and patient, however failed, due to patient having technical difficulties OR patient did not have access to video capability.  We continued and completed visit with audio only.   Reason for visit: same day visit  HPI: Vaginal itching: Patient notes onset of symptoms over the last week.  Minimal vaginal discharge.  Notes there is an odor that is unmistakable for BV.  No fever.  No urinary symptoms.  No abdominal pain.  She is sexually active with only one partner.  She has a history of BV with last episode around 2 years ago.  She was having it 5-6 times a year at that time and they figured out it was related to her tampons.  She switched tampons and has been fine since then though she is in a different brand with this most recent menstrual cycle that ended recently and subsequently developed symptoms.  She notes metronidazole gel  worked very well in the past for her.  She could not tolerate metronidazole orally.   ROS: See pertinent positives and negatives per HPI.  Past Medical History:  Diagnosis Date  . Chronic back pain   . Frequent UTI    d/t congential abnormality   . History of DVT (deep vein thrombosis) 05/2013  . Inappropriate sinus tachycardia   . Phlebitis   . POTS (postural orthostatic tachycardia syndrome)   . Tachycardia   . Urethral spasm   . UTI (urinary tract infection)     Past Surgical History:  Procedure Laterality Date  . CESAREAN SECTION     x 2   . LAMINECTOMY  12/2012   L5/S1  . microdiscetomy  11/2012   L5/S1  . SPINAL FUSION  03/2013   L5/S1  . stent/DVT  05/2013   Left groin    Family History  Problem Relation Age of Onset  . Early death Mother 28  . Transient ischemic attack Mother   . Depression Mother   . Mental illness Mother   . Transient ischemic attack Father   . Breast cancer Paternal Grandmother   . Breast cancer Paternal Aunt   . Breast cancer Paternal Aunt     SOCIAL HX: nonsmoker   Current Outpatient Medications:  .  acebutolol (SECTRAL) 200 MG capsule, TAKE 1 CAPSULE(200 MG) BY MOUTH DAILY, Disp: 90 capsule, Rfl: 1 .  amitriptyline (ELAVIL) 25 MG tablet, TAKE 1 TABLET BY MOUTH AT BEDTIME, Disp: 90 tablet, Rfl: 0 .  clonazePAM (KLONOPIN) 0.5 MG tablet, Take 1 tablet (0.5 mg total)  by mouth at bedtime as needed (insomnia)., Disp: 30 tablet, Rfl: 0 .  HYDROmorphone (DILAUDID) 2 MG tablet, Take 1 tablet (2 mg total) by mouth every 6 (six) hours as needed for severe pain., Disp: 20 tablet, Rfl: 0 .  HYDROmorphone HCl (EXALGO) 8 MG T24A SR tablet, Take 8 mg by mouth daily., Disp: , Rfl:  .  nitrofurantoin (MACRODANTIN) 100 MG capsule, TAKE 1 CAPSULE(100 MG) BY MOUTH DAILY, Disp: 90 capsule, Rfl: 0 .  olopatadine (PATANOL) 0.1 % ophthalmic solution, Place 1 drop into both eyes daily., Disp: 5 mL, Rfl: 3 .  predniSONE (DELTASONE) 10 MG tablet, 40 mg x 3  days, 20 mg x 3 days, 10 mg x 3 days, Disp: 21 tablet, Rfl: 0 .  temazepam (RESTORIL) 30 MG capsule, Take 1 capsule (30 mg total) by mouth at bedtime as needed for sleep., Disp: 30 capsule, Rfl: 2 .  metroNIDAZOLE (METROGEL) 0.75 % vaginal gel, Place 1 Applicatorful vaginally 2 (two) times daily for 5 days., Disp: 100 g, Rfl: 0  EXAM: This is a telephone visit and thus no physical exam was completed.  ASSESSMENT AND PLAN:  Discussed the following assessment and plan:  Vaginal itching Suspect BV as the cause given symptoms and prior history.  We will treat with metronidazole gel.  If not improving with this she will let her primary care office know.   No orders of the defined types were placed in this encounter.   Meds ordered this encounter  Medications  . metroNIDAZOLE (METROGEL) 0.75 % vaginal gel    Sig: Place 1 Applicatorful vaginally 2 (two) times daily for 5 days.    Dispense:  100 g    Refill:  0     I discussed the assessment and treatment plan with the patient. The patient was provided an opportunity to ask questions and all were answered. The patient agreed with the plan and demonstrated an understanding of the instructions.   The patient was advised to call back or seek an in-person evaluation if the symptoms worsen or if the condition fails to improve as anticipated.  I provided 5 minutes of non-face-to-face time during this encounter.   Marikay Alar, MD

## 2020-05-13 NOTE — Telephone Encounter (Signed)
Scheduled physical with pt.

## 2020-05-20 ENCOUNTER — Other Ambulatory Visit: Payer: Self-pay | Admitting: Adult Health

## 2020-05-20 DIAGNOSIS — R000 Tachycardia, unspecified: Secondary | ICD-10-CM

## 2020-05-22 NOTE — Progress Notes (Signed)
Virtual Visit via Video Note  I connected with Christie Hurst on 05/27/20 at 12:00 PM EDT by a video enabled telemedicine application and verified that I am speaking with the correct person using two identifiers.   I discussed the limitations of evaluation and management by telemedicine and the availability of in person appointments. The patient expressed understanding and agreed to proceed.     I discussed the assessment and treatment plan with the patient. The patient was provided an opportunity to ask questions and all were answered. The patient agreed with the plan and demonstrated an understanding of the instructions.   The patient was advised to call back or seek an in-person evaluation if the symptoms worsen or if the condition fails to improve as anticipated.  Location: patient- home, provider- office   I provided 15 minutes of non-face-to-face time during this encounter.   [name], MD    [facility] MD/PA/NP OP Progress Note  05/27/2020 12:31 PM Christie Hurst  MRN:  [id number]  Chief Complaint:  Chief Complaint    Follow-up; Insomnia     HPI:  This is a follow-up appointment for insomnia.  She states that she continues to have insomnia.  She is away from 2 AM since this morning. She cannot find any reason for insomnia, stating that although she used to have insomnia due to pain, she thinks she does not have significant pain lately. However, she later states that the only time she used to sleep well was when she was on fentanyl patch with temazepam 15 mg at night.  She was told by her pain provider that fentanyl patch will not be prescribed for patient without cancer. She tries to stay later so that she can sleep better.  She has been doing regular stretch, although her activity will be limited due to pain.  She has middle insomnia.  She feels fatigue.  She denies feeling depressed.  She denies anxiety.  She denies alcohol use or drug use. She is unable to see a sleep  specialist as she may lose her insurance now that her husband is self employed.    Visit Diagnosis:    ICD-10-CM   1. Insomnia, unspecified type  G47.00 clonazePAM (KLONOPIN) 0.5 MG tablet    Past Psychiatric History: Please see initial evaluation for full details. I have reviewed the history. No updates at this time.     Past Medical History:  Past Medical History:  Diagnosis Date  . Chronic back pain   . Frequent UTI    d/t congential abnormality   . History of DVT (deep vein thrombosis) 05/2013  . Inappropriate sinus tachycardia   . Phlebitis   . POTS (postural orthostatic tachycardia syndrome)   . Tachycardia   . Urethral spasm   . UTI (urinary tract infection)     Past Surgical History:  Procedure Laterality Date  . CESAREAN SECTION     x 2   . LAMINECTOMY  12/2012   L5/S1  . microdiscetomy  11/2012   L5/S1  . SPINAL FUSION  03/2013   L5/S1  . stent/DVT  05/2013   Left groin    Family Psychiatric History: Please see initial evaluation for full details. I have reviewed the history. No updates at this time.     Family History:  Family History  Problem Relation Age of Onset  . Early death Mother 44  . Transient ischemic attack Mother   . Depression Mother   . Mental illness Mother   .  Transient ischemic attack Father   . Breast cancer Paternal Grandmother   . Breast cancer Paternal Aunt   . Breast cancer Paternal Aunt     Social History:  Social History   Socioeconomic History  . Marital status: Married    Spouse name: jamie  . Number of children: 2  . Years of education: Not on file  . Highest education level: Bachelor's degree (e.g., BA, AB, BS)  Occupational History  . Not on file  Tobacco Use  . Smoking status: Never Smoker  . Smokeless tobacco: Never Used  Vaping Use  . Vaping Use: Never used  Substance and Sexual Activity  . Alcohol use: Never  . Drug use: Never  . Sexual activity: Yes  Other Topics Concern  . Not on file  Social  History Narrative   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    Two children ( 15 & 67)    Social Determinants of Health   Financial Resource Strain:   . Difficulty of Paying Living Expenses: Not on file  Food Insecurity:   . Worried About Programme researcher, broadcasting/film/video in the Last Year: Not on file  . Ran Out of Food in the Last Year: Not on file  Transportation Needs:   . Lack of Transportation (Medical): Not on file  . Lack of Transportation (Non-Medical): Not on file  Physical Activity:   . Days of Exercise per Week: Not on file  . Minutes of Exercise per Session: Not on file  Stress:   . Feeling of Stress : Not on file  Social Connections:   . Frequency of Communication with Friends and Family: Not on file  . Frequency of Social Gatherings with Friends and Family: Not on file  . Attends Religious Services: Not on file  . Active Member of Clubs or Organizations: Not on file  . Attends Banker Meetings: Not on file  . Marital Status: Not on file    Allergies:  Allergies  Allergen Reactions  . Tamsulosin Hypertension, Photosensitivity, Rash and Shortness Of Breath  . Tramadol Anxiety, Palpitations and Shortness Of Breath  . Carbamazepine Diarrhea and Other (See Comments)  . Cephalexin Hives  . Duloxetine Hcl Other (See Comments)  . Lamotrigine Photosensitivity  . Levorphanol   . Remeron [Mirtazapine]     Chest pain  . Seroquel [Quetiapine Fumarate]     "out of it" it opposite of what it was suppose to do  . Buprenorphine Hcl Anxiety and Palpitations  . Sulfamethoxazole-Trimethoprim Hives and Palpitations    Metabolic Disorder Labs: Lab Results  Component Value Date   HGBA1C 5.2 06/16/2019   No results found for: PROLACTIN Lab Results  Component Value Date   CHOL 224 (H) 06/16/2019   TRIG 118.0 06/16/2019   HDL 64.50 06/16/2019   CHOLHDL 3 06/16/2019   VLDL 23.6 06/16/2019   LDLCALC 136 (H) 06/16/2019   Lab Results  Component Value Date   TSH 0.73  06/16/2019    Therapeutic Level Labs: No results found for: LITHIUM No results found for: VALPROATE No components found for:  CBMZ  Current Medications: Current Outpatient Medications  Medication Sig Dispense Refill  . acebutolol (SECTRAL) 200 MG capsule TAKE 1 CAPSULE(200 MG) BY MOUTH DAILY 90 capsule 1  . amitriptyline (ELAVIL) 25 MG tablet TAKE 1 TABLET BY MOUTH AT BEDTIME 90 tablet 0  . [START ON 06/06/2020] clonazePAM (KLONOPIN) 0.5 MG tablet Take 1 tablet (0.5 mg total) by mouth at  bedtime as needed (insomnia). 30 tablet 0  . HYDROmorphone (DILAUDID) 2 MG tablet Take 1 tablet (2 mg total) by mouth every 6 (six) hours as needed for severe pain. 20 tablet 0  . HYDROmorphone HCl (EXALGO) 8 MG T24A SR tablet Take 8 mg by mouth daily.    . nitrofurantoin (MACRODANTIN) 100 MG capsule TAKE 1 CAPSULE(100 MG) BY MOUTH DAILY 90 capsule 0  . olopatadine (PATANOL) 0.1 % ophthalmic solution Place 1 drop into both eyes daily. 5 mL 3  . predniSONE (DELTASONE) 10 MG tablet 40 mg x 3 days, 20 mg x 3 days, 10 mg x 3 days 21 tablet 0  . temazepam (RESTORIL) 30 MG capsule Take 1 capsule (30 mg total) by mouth at bedtime as needed for sleep. 30 capsule 2   No current facility-administered medications for this visit.     Musculoskeletal: Strength & Muscle Tone: N/A Gait & Station: N/A Patient leans: N/A  Psychiatric Specialty Exam: Review of Systems  Psychiatric/Behavioral: Positive for sleep disturbance. Negative for agitation, behavioral problems, confusion, decreased concentration, dysphoric mood, hallucinations, self-injury and suicidal ideas. The patient is not nervous/anxious and is not hyperactive.   All other systems reviewed and are negative.   There were no vitals taken for this visit.There is no height or weight on file to calculate BMI.  General Appearance: Fairly Groomed  Eye Contact:  Good  Speech:  Clear and Coherent  Volume:  Normal  Mood:  tired  Affect:  Appropriate,  Congruent and fatigue  Thought Process:  Coherent  Orientation:  Full (Time, Place, and Person)  Thought Content: Logical   Suicidal Thoughts:  No  Homicidal Thoughts:  No  Memory:  Immediate;   Good  Judgement:  Good  Insight:  Good  Psychomotor Activity:  Normal  Concentration:  Concentration: Good and Attention Span: Good  Recall:  Good  Fund of Knowledge: Good  Language: Good  Akathisia:  No  Handed:  Right  AIMS (if indicated): not done  Assets:  Communication Skills Desire for Improvement  ADL's:  Intact  Cognition: WNL  Sleep:  Poor   Screenings:   Assessment and Plan:  Christie Hurst is a 44 y.o. year old female with a history of  insomnia, chronic pain syndrome,multiple back surgeries with complications,urethral spasm,congenital anomaly of the ureter, inappropriate tachycardia, who presents for follow up appointment for below.   1. Insomnia, unspecified type She continues to report insomnia since she had prednisone taper.  Discussed sleep hygiene.  Discussed with the patient that I will not uptitrate her medication at this time to avoid potential risk of respiratory suppression and oversedation especially with concomitant use of opioid.  She agrees with the plans.  Will continue clonazepam and temazepam for insomnia.  Noted that although it is preferable to do monotherapy, benefits outweighs risk given her past trials of medication, which led to adverse reaction.  Noted that although referral for sleep evaluation was recommended, she will not be able to do this at this time given she might lose her insurance.   Plan I have reviewed and updated plans as below 1. Continue clonazepam 0.25-0.5mg  daily as needed forinsomnia  2. Continue temazepam30mg  at night as needed forinsomnia 3.Next appointment: Return as needed (I would advise to discuss with your primary care doctor if he/she can continue the current medication given the situation with your insurance.) - on  amitriptyline 25 mg at night(limited benefit from higher dose; prescribed by urology) - on hydromorphone 2 mg  prn  Past trials of medication:citalopram (jaw stiffness), sertraline (restless), fluoxetine (restless),duloxetine,mirtazapine (chest pain),Quetiapine- "sick,"Baclofen- crazy dreams,hydroxyzine (hyperactive),gabapentin (drowsiness),melatonin,Ambien (middle insomnia),trazodone, temazepam, clonazepam,diazepam  I have utilized the [place] Controlled Substances Reporting System (PMP AW[place]) to confirm adherence regarding the patient's medication. My review reveals appropriate prescription fills.   The patient demonstrates the following risk factors for suicide: Chronic risk factors for suicide include:psychiatric disorder ofanxietyand chronic pain. Acute risk factorsfor suicide include: family or marital conflict and unemployment. Protective factorsfor this patient include: positive social support, responsibility to others (children, family), coping skills and hope for the future. Considering these factors, the overall suicide risk at this point appears to below. Patientisappropriate for outpatient follow up.   [name], MD 05/27/2020, 12:31 PM

## 2020-05-27 ENCOUNTER — Telehealth (INDEPENDENT_AMBULATORY_CARE_PROVIDER_SITE_OTHER): Payer: 59 | Admitting: Psychiatry

## 2020-05-27 ENCOUNTER — Encounter (HOSPITAL_COMMUNITY): Payer: Self-pay | Admitting: Psychiatry

## 2020-05-27 ENCOUNTER — Other Ambulatory Visit: Payer: Self-pay

## 2020-05-27 DIAGNOSIS — R000 Tachycardia, unspecified: Secondary | ICD-10-CM

## 2020-05-27 DIAGNOSIS — N390 Urinary tract infection, site not specified: Secondary | ICD-10-CM

## 2020-05-27 DIAGNOSIS — N3289 Other specified disorders of bladder: Secondary | ICD-10-CM

## 2020-05-27 DIAGNOSIS — G8929 Other chronic pain: Secondary | ICD-10-CM | POA: Diagnosis not present

## 2020-05-27 DIAGNOSIS — M5442 Lumbago with sciatica, left side: Secondary | ICD-10-CM

## 2020-05-27 DIAGNOSIS — G4700 Insomnia, unspecified: Secondary | ICD-10-CM | POA: Diagnosis not present

## 2020-05-27 MED ORDER — CLONAZEPAM 0.5 MG PO TABS: 0.5000 mg | ORAL_TABLET | Freq: Every evening | ORAL | 0 refills | Status: DC | PRN

## 2020-05-27 NOTE — Patient Instructions (Signed)
1. Continue clonazepam 0.25-0.5mg  daily as needed forinsomnia  2. Continue temazepam30mg  at night as needed forinsomnia 3.Next appointment: Return as needed (I would advise to discuss with your primary care doctor if he/she can continue the current medication given the situation with your insurance.)

## 2020-06-22 ENCOUNTER — Other Ambulatory Visit: Payer: Self-pay | Admitting: Adult Health

## 2020-06-26 ENCOUNTER — Encounter: Payer: Self-pay | Admitting: Adult Health

## 2020-06-26 ENCOUNTER — Ambulatory Visit (INDEPENDENT_AMBULATORY_CARE_PROVIDER_SITE_OTHER): Payer: Self-pay | Admitting: Adult Health

## 2020-06-26 ENCOUNTER — Other Ambulatory Visit: Payer: Self-pay

## 2020-06-26 VITALS — BP 115/76 | HR 98 | Temp 96.7°F | Ht 64.0 in | Wt 126.6 lb

## 2020-06-26 DIAGNOSIS — M5442 Lumbago with sciatica, left side: Secondary | ICD-10-CM

## 2020-06-26 DIAGNOSIS — N390 Urinary tract infection, site not specified: Secondary | ICD-10-CM

## 2020-06-26 DIAGNOSIS — N3289 Other specified disorders of bladder: Secondary | ICD-10-CM

## 2020-06-26 DIAGNOSIS — G8929 Other chronic pain: Secondary | ICD-10-CM

## 2020-06-26 DIAGNOSIS — R000 Tachycardia, unspecified: Secondary | ICD-10-CM

## 2020-06-26 DIAGNOSIS — N35919 Unspecified urethral stricture, male, unspecified site: Secondary | ICD-10-CM

## 2020-06-26 DIAGNOSIS — G4700 Insomnia, unspecified: Secondary | ICD-10-CM

## 2020-06-26 MED ORDER — PREDNISONE 10 MG PO TABS: ORAL_TABLET | ORAL | 0 refills | Status: DC

## 2020-06-26 NOTE — Progress Notes (Addendum)
Subjective:    Patient ID: Christie Hurst, female    DOB: 1976/07/27, 44 y.o.   MRN: [id number]  HPI   She is here for yearly follow up exam. Currently on COBRA insurance.   H/o appropriate sinus tachycardia-is well controlled on Sectral 200 mg  Urethral spasms-takes Elavil 25 mg nightly.  Denies issues  History of chronic UTIs-takes Macrobid daily.  She denies any recent UTIs  Chronic pain-s/p bar fusion/laminectomy, and microdiscectomy.  She is currently managed by pain management.  Is prescribed hydromorphone 8 mg ER daily and hydromorphone 2 mg IR every 6 hours for breakthrough pain.  She has been tried on various other pain medications throughout the years but had reactions to them.  She does report that this works pretty well but continues to be in pain every day.  She is unable to do much around the house.  Periodically I will prescribe her steroid tapers to help with severe breakthrough pain.  Insomnia-due to chronic pain.  She is currently prescribed Restoril 30 mg and Klonopin 0.5 mg nightly.  This medication is managed by psychiatry  All immunizations and health maintenance protocols were reviewed with the patient and needed orders were placed. Refuses Covid vaccination.   Appropriate screening laboratory values were ordered for the patient including screening of hyperlipidemia, renal function and hepatic function.  Medication reconciliation,  past medical history, social history, problem list and allergies were reviewed in detail with the patient  Goals were established with regard to weight loss, exercise, and  diet in compliance with medications  She is due for Pap smear, mammogram and routine colon cancer screening - per new guidelines - she will hold off until next year  Review of Systems  Constitutional: Negative.   HENT: Negative.   Eyes: Negative.   Respiratory: Negative.   Cardiovascular: Negative.   Endocrine: Negative.   Musculoskeletal: Positive for  arthralgias and back pain.  Skin: Negative.   Allergic/Immunologic: Negative.   Neurological: Negative.   Hematological: Negative.   Psychiatric/Behavioral: Negative.      Past Medical History:  Diagnosis Date  . Chronic back pain   . Frequent UTI    d/t congential abnormality   . History of DVT (deep vein thrombosis) 05/2013  . Inappropriate sinus tachycardia   . Phlebitis   . POTS (postural orthostatic tachycardia syndrome)   . Tachycardia   . Urethral spasm   . UTI (urinary tract infection)     Social History   Socioeconomic History  . Marital status: Married    Spouse name: jamie  . Number of children: 2  . Years of education: Not on file  . Highest education level: Bachelor's degree (e.g., BA, AB, BS)  Occupational History  . Not on file  Tobacco Use  . Smoking status: Never Smoker  . Smokeless tobacco: Never Used  Vaping Use  . Vaping Use: Never used  Substance and Sexual Activity  . Alcohol use: Never  . Drug use: Never  . Sexual activity: Yes  Other Topics Concern  . 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 Difficulty of Paying Living Expenses: Not on file  Food Insecurity:   . Worried About Programme researcher, broadcasting/film/video in the Last Year: Not on file  . Ran Out of Food in  the Last Year: Not on file  Transportation Needs:   . Lack of Transportation (Medical): Not on file  . Lack of Transportation (Non-Medical): Not on file  Physical Activity:   . Days of Exercise per Week: Not on file  . Minutes of Exercise per Session: Not on file  Stress:   . Feeling of Stress : Not on file  Social Connections:   . Frequency of Communication with Friends and Family: Not on file  . Frequency of Social Gatherings with Friends and Family: Not on file  . Attends Religious Services: Not on file  . Active Member of Clubs or Organizations: Not  on file  . Attends Banker Meetings: Not on file  . Marital Status: Not on file  Intimate Partner Violence:   . Fear of Current or Ex-Partner: Not on file  . Emotionally Abused: Not on file  . Physically Abused: Not on file  . Sexually Abused: Not on file    Past Surgical History:  Procedure Laterality Date  . CESAREAN SECTION     x 2   . LAMINECTOMY  12/2012   L5/S1  . microdiscetomy  11/2012   L5/S1  . SPINAL FUSION  03/2013   L5/S1  . stent/DVT  05/2013   Left groin    Family History  Problem Relation Age of Onset  . Early death Mother 55  . Transient ischemic attack Mother   . Depression Mother   . Mental illness Mother   . Transient ischemic attack Father   . Breast cancer Paternal Grandmother   . Breast cancer Paternal Aunt   . Breast cancer Paternal Aunt     Allergies  Allergen Reactions  . Tamsulosin Hypertension, Photosensitivity, Rash and Shortness Of Breath  . Tramadol Anxiety, Palpitations and Shortness Of Breath  . Carbamazepine Diarrhea and Other (See Comments)  . Cephalexin Hives  . Duloxetine Hcl Other (See Comments)  . Lamotrigine Photosensitivity  . Levorphanol   . Remeron [Mirtazapine]     Chest pain  . Seroquel [Quetiapine Fumarate]     "out of it" it opposite of what it was suppose to do  . Buprenorphine Hcl Anxiety and Palpitations  . Sulfamethoxazole-Trimethoprim Hives and Palpitations    Current Outpatient Medications on File Prior to Visit  Medication Sig Dispense Refill  . acebutolol (SECTRAL) 200 MG capsule TAKE 1 CAPSULE(200 MG) BY MOUTH DAILY 90 capsule 1  . amitriptyline (ELAVIL) 25 MG tablet TAKE 1 TABLET(25 MG) BY MOUTH AT BEDTIME 90 tablet 1  . clonazePAM (KLONOPIN) 0.5 MG tablet Take 1 tablet (0.5 mg total) by mouth at bedtime as needed (insomnia). 30 tablet 0  . HYDROmorphone (DILAUDID) 2 MG tablet Take 1 tablet (2 mg total) by mouth every 6 (six) hours as needed for severe pain. 20 tablet 0  . HYDROmorphone  HCl (EXALGO) 8 MG T24A SR tablet Take 8 mg by mouth daily.    . nitrofurantoin (MACRODANTIN) 100 MG capsule TAKE 1 CAPSULE(100 MG) BY MOUTH DAILY 90 capsule 3  . olopatadine (PATANOL) 0.1 % ophthalmic solution Place 1 drop into both eyes daily. 5 mL 3  . predniSONE (DELTASONE) 10 MG tablet 40 mg x 3 days, 20 mg x 3 days, 10 mg x 3 days 21 tablet 0  . temazepam (RESTORIL) 30 MG capsule Take 1 capsule (30 mg total) by mouth at bedtime as needed for sleep. 30 capsule 2   No current facility-administered medications on file prior to visit.    There  were no vitals taken for this visit.      Objective:   Physical Exam Vitals and nursing note reviewed.  Constitutional:      General: She is not in acute distress.    Appearance: Normal appearance. She is well-developed. She is not ill-appearing.  HENT:     Head: Normocephalic and atraumatic.     Right Ear: Tympanic membrane, ear canal and external ear normal. There is no impacted cerumen.     Left Ear: Tympanic membrane, ear canal and external ear normal. There is no impacted cerumen.  Eyes:     General:        Right eye: No discharge.        Left eye: No discharge.     Extraocular Movements: Extraocular movements intact.     Conjunctiva/sclera: Conjunctivae normal.     Pupils: Pupils are equal, round, and reactive to light.  Neck:     Thyroid: No thyromegaly.     Vascular: No carotid bruit.     Trachea: No tracheal deviation.  Cardiovascular:     Rate and Rhythm: Normal rate and regular rhythm.     Pulses: Normal pulses.     Heart sounds: Normal heart sounds. No murmur heard.  No friction rub. No gallop.   Pulmonary:     Effort: Pulmonary effort is normal. No respiratory distress.     Breath sounds: Normal breath sounds. No stridor. No wheezing, rhonchi or rales.  Chest:     Chest wall: No tenderness.  Abdominal:     General: Abdomen is flat. Bowel sounds are normal. There is no distension.     Palpations: Abdomen is soft. There  is no mass.     Tenderness: There is no abdominal tenderness. There is no right CVA tenderness, left CVA tenderness, guarding or rebound.     Hernia: No hernia is present.  Musculoskeletal:        General: No swelling, tenderness, deformity or signs of injury. Normal range of motion.     Cervical back: Normal range of motion and neck supple.     Right lower leg: No edema.     Left lower leg: No edema.  Lymphadenopathy:     Cervical: No cervical adenopathy.  Skin:    General: Skin is warm and dry.     Coloration: Skin is not jaundiced or pale.     Findings: No bruising, erythema, lesion or rash.  Neurological:     General: No focal deficit present.     Mental Status: She is alert and oriented to person, place, and time.     Cranial Nerves: No cranial nerve deficit.     Sensory: No sensory deficit.     Motor: No weakness.     Coordination: Coordination normal.     Gait: Gait normal.     Deep Tendon Reflexes: Reflexes normal.  Psychiatric:        Mood and Affect: Mood normal.        Behavior: Behavior normal.        Thought Content: Thought content normal.        Judgment: Judgment normal.       Assessment & Plan:  1. Chronic bilateral low back pain with left-sided sciatica - Continue with pain management. Will send in prednisone taper for her to have on hand for when pain gets bad - predniSONE (DELTASONE) 10 MG tablet; 40 mg x 3 days, 20 mg x 3 days, 10 mg x 3 days  Dispense: 21 tablet; Refill: 0  2. Urethral spasm - Continue with Elavil   3. Inappropriate sinus tachycardia - continue with Sectral 200 mg daily   4. Insomnia, unspecified type - Follow up with psychiatry and continue Restoril   5. Chronic UTI - Continue with Macrobid   Shirline Frees, NP  * will hold off on routine labs this year due to insurance and financial constraints.

## 2020-08-01 ENCOUNTER — Other Ambulatory Visit: Payer: Self-pay | Admitting: Psychiatry

## 2020-08-01 ENCOUNTER — Telehealth: Payer: Self-pay | Admitting: Adult Health

## 2020-08-01 ENCOUNTER — Encounter: Payer: Self-pay | Admitting: Adult Health

## 2020-08-01 ENCOUNTER — Telehealth: Payer: Self-pay

## 2020-08-01 DIAGNOSIS — G4700 Insomnia, unspecified: Secondary | ICD-10-CM

## 2020-08-01 MED ORDER — TEMAZEPAM 30 MG PO CAPS: 30.0000 mg | ORAL_CAPSULE | Freq: Every evening | ORAL | 0 refills | Status: DC | PRN

## 2020-08-01 NOTE — Telephone Encounter (Signed)
Could you ask her if she would be able to have her primary care prescribe this medication, or whether she is planning to see me again? Please make follow up if the latter is the case.

## 2020-08-01 NOTE — Telephone Encounter (Signed)
THe patient would like to [place] to Dr. Hassan Rowan on because both of her children, Yacine Droz and Avacyn Kloosterman see her. She would also like a female provider to also do her yearly female check.  Is it okay to schedule TOC for this patient?

## 2020-08-01 NOTE — Telephone Encounter (Signed)
Medication refill request - message left for patient questioning if she would be seeing her primary care for ongoing future medications and if so requested she ask them for the Temazepam refill but if not to reschedule with Dr. Vanetta Shawl for first available

## 2020-08-01 NOTE — Telephone Encounter (Signed)
Medication management - Telephone call with patient to inform a one time order for her Temazepam was sent into her pharmacy for her and pt stated understanding she will need to schedule an appt for further refills.

## 2020-08-01 NOTE — Telephone Encounter (Signed)
Medication refill request - Patient called requesting a refill of her Temazepam 30 mg capsules be sent into [facility] on [address]. [place].  Last order provided 05/10/20 + 2 refills and patient last evaluated 05/27/20. No current follow up scheduled as patient was instructed to schedule as needed at last eval and may later see her primary care if insurance issues persist.

## 2020-08-01 NOTE — Telephone Encounter (Signed)
Medication management - Patient called back and stated her primary care provider did not want to take over writing prescriptions so she will make a new appointment with Dr. Vanetta Shawl. States she will be trying to find a new PCP that may work with her. Patient requested a refill of medication and will call [facility] receptionist to set up a first available appointment with Dr. Vanetta Shawl.

## 2020-08-05 NOTE — Telephone Encounter (Signed)
ok 

## 2020-08-08 NOTE — Telephone Encounter (Signed)
That is fine. Thanks 

## 2020-08-08 NOTE — Telephone Encounter (Signed)
Contacted patient to contact the office to schedule a TOC appointment to Dr. Hassan Rowan from [facility].

## 2020-08-29 NOTE — Progress Notes (Signed)
Virtual Visit via Video Note  I connected with Christie Hurst on 09/04/20 at 10:00 AM EST by a video enabled telemedicine application and verified that I am speaking with the correct person using two identifiers.  Location: Patient: home Provider: office   I discussed the limitations of evaluation and management by telemedicine and the availability of in person appointments. The patient expressed understanding and agreed to proceed.    I discussed the assessment and treatment plan with the patient. The patient was provided an opportunity to ask questions and all were answered. The patient agreed with the plan and demonstrated an understanding of the instructions.   The patient was advised to call back or seek an in-person evaluation if the symptoms worsen or if the condition fails to improve as anticipated.  I provided 15 minutes of non-face-to-face time during this encounter.   [name], MD     [facility] MD/PA/NP OP Progress Note  09/04/2020 10:33 AM Christie Hurst  MRN:  [id number]  Chief Complaint:  Chief Complaint    Follow-up; Other     HPI:  This is a follow-up appointment for insomnia.  She states that she is sleeping well when she is on prednisone as her pain is better.  However, she is allowed to have prednisone 4 times per year only.  She tends to have insomnia when she has pain.  She also needs to pace her activity as she has exacerbated pain for a week if she does more.  She is looking forward for her daughter in [place] to stay with her for a few weeks.  Her daughter may have Ehlers-Danlos syndrome.  She also talks about recent interaction with her PCP, who she felt was forced for vaccination despite that she does not want to take any chance of her pain getting worse.  She is planning to see another PCP in Feb. she occasionally feels anxious and tense.  She denies feeling depressed.  She denies SI.  She denies alcohol use or drug use.  She takes clonazepam once a month or  less.    Visit Diagnosis:    ICD-10-CM   1. Insomnia, unspecified type  G47.00 temazepam (RESTORIL) 30 MG capsule    Past Psychiatric History: Please see initial evaluation for full details. I have reviewed the history. No updates at this time.     Past Medical History:  Past Medical History:  Diagnosis Date  . Chronic back pain   . Frequent UTI    d/t congential abnormality   . History of DVT (deep vein thrombosis) 05/2013  . Inappropriate sinus tachycardia   . Phlebitis   . POTS (postural orthostatic tachycardia syndrome)   . Tachycardia   . Urethral spasm   . UTI (urinary tract infection)     Past Surgical History:  Procedure Laterality Date  . CESAREAN SECTION     x 2   . LAMINECTOMY  12/2012   L5/S1  . microdiscetomy  11/2012   L5/S1  . SPINAL FUSION  03/2013   L5/S1  . stent/DVT  05/2013   Left groin    Family Psychiatric History: Please see initial evaluation for full details. I have reviewed the history. No updates at this time.     Family History:  Family History  Problem Relation Age of Onset  . Early death Mother 56  . Transient ischemic attack Mother   . Depression Mother   . Mental illness Mother   . Transient ischemic attack Father   .  Breast cancer Paternal Grandmother   . Breast cancer Paternal Aunt   . Breast cancer Paternal Aunt     Social History:  Social History   Socioeconomic History  . Marital status: Married    Spouse name: jamie  . Number of children: 2  . Years of education: Not on file  . Highest education level: Bachelor's degree (e.g., BA, AB, BS)  Occupational History  . Not on file  Tobacco Use  . Smoking status: Never Smoker  . Smokeless tobacco: Never Used  Vaping Use  . Vaping Use: Never used  Substance and Sexual Activity  . Alcohol use: Never  . Drug use: Never  . Sexual activity: Yes  Other Topics Concern  . 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 Tamsulosin Hypertension, Photosensitivity, Rash and Shortness Of Breath  . Tramadol Anxiety, Palpitations and Shortness Of Breath  . Carbamazepine Diarrhea and Other (See Comments)  . Cephalexin Hives  . Duloxetine Hcl Other (See Comments)  . Lamotrigine Photosensitivity  . Levorphanol   . Remeron [Mirtazapine]     Chest pain  . Seroquel [Quetiapine Fumarate]     "out of it" it opposite of what it was suppose to do  . Buprenorphine Hcl Anxiety and Palpitations  . Sulfamethoxazole-Trimethoprim Hives and Palpitations    Metabolic Disorder Labs: Lab Results  Component Value Date   HGBA1C 5.2 06/16/2019   No results found for: PROLACTIN Lab Results  Component Value Date   CHOL 224 (H) 06/16/2019   TRIG 118.0 06/16/2019   HDL 64.50 06/16/2019   CHOLHDL 3 06/16/2019   VLDL 23.6 06/16/2019   LDLCALC 136 (H) 06/16/2019   Lab Results  Component Value Date   TSH 0.73 06/16/2019    Therapeutic Level Labs: No results found for: LITHIUM No results found for: VALPROATE No components found for:  CBMZ  Current Medications: Current Outpatient Medications  Medication Sig Dispense Refill  . acebutolol (SECTRAL) 200 MG capsule TAKE 1 CAPSULE(200 MG) BY MOUTH DAILY 90 capsule 1  . amitriptyline (ELAVIL) 25 MG tablet TAKE 1 TABLET(25 MG) BY MOUTH AT BEDTIME 90 tablet 1  . clonazePAM (KLONOPIN) 0.5 MG tablet Take 1 tablet (0.5 mg total) by mouth at bedtime as needed (insomnia). 30 tablet 0  . HYDROmorphone (DILAUDID) 2 MG tablet Take 1 tablet (2 mg total) by mouth every 6 (six) hours as needed for severe pain. 20 tablet 0  . nitrofurantoin (MACRODANTIN) 100 MG capsule TAKE 1 CAPSULE(100 MG)  BY MOUTH DAILY 90 capsule 3  . olopatadine (PATANOL) 0.1 % ophthalmic solution Place 1 drop into both eyes daily. 5 mL 3  . predniSONE (DELTASONE) 10 MG tablet 40 mg x 3 days, 20 mg x 3 days, 10 mg x 3 days 21 tablet 0  . temazepam (RESTORIL) 30 MG capsule Take 1 capsule (30 mg total) by mouth at bedtime as needed for sleep. 30 capsule 2   No current facility-administered medications for this visit.     Musculoskeletal: Strength & Muscle Tone: N/A Gait & Station: N/A Patient leans:  N/A  Psychiatric Specialty Exam: Review of Systems  Psychiatric/Behavioral: Positive for sleep disturbance. Negative for agitation, behavioral problems, confusion, decreased concentration, dysphoric mood, hallucinations, self-injury and suicidal ideas. The patient is nervous/anxious. The patient is not hyperactive.   All other systems reviewed and are negative.   There were no vitals taken for this visit.There is no height or weight on file to calculate BMI.  General Appearance: Fairly Groomed  Eye Contact:  Good  Speech:  Clear and Coherent  Volume:  Normal  Mood:  fine  Affect:  Appropriate, Congruent and slightly anxious at times  Thought Process:  Coherent  Orientation:  Full (Time, Place, and Person)  Thought Content: Logical   Suicidal Thoughts:  No  Homicidal Thoughts:  No  Memory:  Immediate;   Good  Judgement:  Good  Insight:  Good  Psychomotor Activity:  Normal  Concentration:  Concentration: Good and Attention Span: Good  Recall:  Good  Fund of Knowledge: Good  Language: Good  Akathisia:  No  Handed:  Right  AIMS (if indicated): not done  Assets:  Communication Skills Desire for Improvement  ADL's:  Intact  Cognition: WNL  Sleep:  Fair   Screenings: Secondary school teacher Row Office Visit from 06/26/2020 in [facility] Total Score 0       Assessment and Plan:  Christie Hurst is a 45 y.o. year old female with a history of insomnia, chronic pain  syndrome,multiple back surgeries with complications,urethral spasm,congenital anomaly of the ureter, inappropriate tachycardia, who presents for follow up appointment for below.   1. Insomnia, unspecified type She continues to have insomnia secondary to pain, although it improves when she is on prednisone.  Other psychosocial stressors includes her daughter with possible diagnosis of Ehlers-Danlos syndrome.  Will continue temazepam at the current dose for insomnia.  Noted that although she does have clonazepam as well for insomnia, she rarely takes this medication.  She is aware of the risk of dependence and oversedation.  Noted that she did have past trials of medication for sleep with limited benefit/adverse reaction; benefits of staying on benzodiazepine outweighs risk.  Noted that although referral for sleep evaluation was recommended in the past, she wanted to hold this option due to her insurance issues.   Plan I have reviewed and updated plans as below 1.  Continue temazepam30mg  at night as needed forinsomnia 2. Continue clonazepam 0.25-0.5mg  daily as needed forinsomnia - she rarely takes this, and declined refill 3.Next appointment: Return as needed (I would advise to discuss with your primary care doctor if he/she can take over the prescription.) - on amitriptyline 25 mg at night(limited benefit from higher dose; prescribed by urology) - on hydromorphone 2 mg prn  Past trials of medication:citalopram (jaw stiffness), sertraline (restless), fluoxetine (restless),duloxetine,mirtazapine (chest pain),Quetiapine- "sick,"Baclofen- crazy dreams,hydroxyzine (hyperactive),gabapentin (drowsiness),melatonin,Ambien (middle insomnia),trazodone, temazepam, clonazepam,diazepam   The patient demonstrates the following risk factors for suicide: Chronic risk factors for suicide include:psychiatric disorder ofanxietyand chronic pain. Acute risk factorsfor suicide include: family  or marital conflict and unemployment. Protective factorsfor this patient include: positive social support, responsibility to others (children, family), coping skills and hope for the future. Considering these factors, the overall suicide risk at this point appears to below. Patientisappropriate for outpatient follow up.   [name], MD 09/04/2020, 10:33 AM

## 2020-08-30 DIAGNOSIS — G894 Chronic pain syndrome: Secondary | ICD-10-CM | POA: Diagnosis not present

## 2020-08-30 DIAGNOSIS — Z79899 Other long term (current) drug therapy: Secondary | ICD-10-CM | POA: Diagnosis not present

## 2020-08-30 DIAGNOSIS — M961 Postlaminectomy syndrome, not elsewhere classified: Secondary | ICD-10-CM | POA: Diagnosis not present

## 2020-09-04 ENCOUNTER — Telehealth (INDEPENDENT_AMBULATORY_CARE_PROVIDER_SITE_OTHER): Payer: Medicare Other | Admitting: Psychiatry

## 2020-09-04 ENCOUNTER — Other Ambulatory Visit: Payer: Self-pay

## 2020-09-04 ENCOUNTER — Encounter: Payer: Self-pay | Admitting: Psychiatry

## 2020-09-04 DIAGNOSIS — G4700 Insomnia, unspecified: Secondary | ICD-10-CM | POA: Diagnosis not present

## 2020-09-04 MED ORDER — TEMAZEPAM 30 MG PO CAPS: 30.0000 mg | ORAL_CAPSULE | Freq: Every evening | ORAL | 2 refills | Status: DC | PRN

## 2020-09-04 NOTE — Patient Instructions (Signed)
1.  Continue temazepam30mg  at night as needed forinsomnia 2. Continue clonazepam 0.25-0.5mg  daily as needed forinsomnia  3.Next appointment: Return as needed (I would advise to discuss with your primary care doctor if he/she can take over the prescription.)

## 2020-09-16 ENCOUNTER — Encounter: Payer: Self-pay | Admitting: Adult Health

## 2020-09-17 ENCOUNTER — Other Ambulatory Visit: Payer: Self-pay | Admitting: Adult Health

## 2020-09-17 MED ORDER — PREDNISONE 20 MG PO TABS: ORAL_TABLET | ORAL | 0 refills | Status: DC

## 2020-09-26 ENCOUNTER — Other Ambulatory Visit: Payer: Self-pay

## 2020-09-26 NOTE — Progress Notes (Signed)
Christie Hurst DOB: Dec 27, 1975 Encounter date: 09/27/2020  This is a 45 y.o. female who presents to establish care. Chief Complaint  Patient presents with  . Establish Care    History of present illness: Has been having menstrual problems and had actually made appointment before she could get in with me. Missed period in November, and then in December had just one day of bleeding. Then 1/5 started bleeding, but didn't have other sx of period (like crampy, hormonal fluctuation). Has been bleeding every day since jan 5th. [facility]. Had pap, bloodwork - checking thyroid, hcg, fsh, prolactin and mammogram. Want her to return for US guided uterine biopsy - which she has scheduled for 3/1. Saw Dr. Velvet Bathe.   She never saw hematologist to rule out clotting disorder; this was something mentioned at her visit with Dr. Velvet Bathe today. Had 2 blood clots - first given heparin and coumadin; then got clot while on these and was back in hospital - put on heparin and had bleed and was transferred to another hospital; anticoag therapy stopped and she had anticoag injection in vein - overnight in ICU and then stented that vein. Then heart rate kept increasing and ended up with cardiologist. And they suspected she didn't metabolize coumadin properly and was switched to xarelto which she took for a year. Cardiologist retired so then didn't have followup. Tried other medications for heart rate control, but made her hypotensive. She is taking aspirin now - she states that she put herself on this because she is worried about getting another blood clot.   States that entire life was very healthy, active. Was nursing, doing ski patrol. Then started having leg pain left leg which progressively got worse. Started with adjustments from DO she worked with, then getting leg numbness, more severe pain. Had MRI - ruptured disc. Went to surgery about 3 days later. Had microdiscectomy. 5 days  later feeling good, but severe leg pain came back shortly after. She was told that it likely re-ruptured. Tried to manage, but hurt so badly didn't make it a week. Was admitted from office at follow up and ended up with laminectomy. Was in hospital for 3 days. By then nerve was damaged and still in tremendous pain in entire left leg. Was on high dose prednisone frequently. Pain medication didn't help. Couldn't even walk on left foot. Then saw neurosurgery and had fusion a few months after laminectomy. Nerve pain didn't improve. She had known DDD above area of concern. Then next month she got blood clot, PE. She was in and out of hospital until December with issues related to this. In pain all the time. Activity makes pain worse. Has to pace self. ie having two doc visits today will wipe her out for a couple of days. Nerve will just flare up sometimes and only thing that really helps is prednisone. Has been getting this from [place]. Injections cause her side effects for a couple of months - can't sleep. Without the prednisone she can't tolerate the leg pain. She understands risks of taking prednisone. Just completed course of prednisone 2 days ago. Does take hydromorphone usually once daily; takes at night when really bad. Usually able to get through day without this. Can't do anything that contracts muscle in left leg - this all sets nerve off. Bad flares she states are probably every few months. Has to push to this.   States that mood is better now than it was 3  years ago.   Gets restoril just from psych for sleep.  Past Medical History:  Diagnosis Date  . Chronic back pain   . Frequent UTI    d/t congential abnormality   . History of DVT (deep vein thrombosis) 05/2013  . Inappropriate sinus tachycardia   . Phlebitis   . POTS (postural orthostatic tachycardia syndrome)   . Tachycardia   . Urethral spasm   . UTI (urinary tract infection)    Past Surgical History:  Procedure Laterality Date  .  CESAREAN SECTION     x 2   . LAMINECTOMY  12/2012   L5/S1  . microdiscetomy  11/2012   L5/S1  . SPINAL FUSION  03/2013   L5/S1  . stent/DVT  05/2013   Left groin   Allergies  Allergen Reactions  . Tamsulosin Hypertension, Photosensitivity, Rash and Shortness Of Breath  . Tramadol Anxiety, Palpitations and Shortness Of Breath  . Carbamazepine Diarrhea and Other (See Comments)  . Cephalexin Hives  . Duloxetine Hcl Other (See Comments)  . Lamotrigine Photosensitivity  . Levorphanol   . Remeron [Mirtazapine]     Chest pain  . Seroquel [Quetiapine Fumarate]     "out of it" it opposite of what it was suppose to do  . Buprenorphine Hcl Anxiety and Palpitations  . Sulfamethoxazole-Trimethoprim Hives and Palpitations   Current Meds  Medication Sig  . acebutolol (SECTRAL) 200 MG capsule TAKE 1 CAPSULE(200 MG) BY MOUTH DAILY  . amitriptyline (ELAVIL) 25 MG tablet TAKE 1 TABLET(25 MG) BY MOUTH AT BEDTIME  . aspirin 325 MG tablet Take 325 mg by mouth daily.  Marland Kitchen CALCIUM PO Take by mouth daily.  . clonazePAM (KLONOPIN) 0.5 MG tablet Take 1 tablet (0.5 mg total) by mouth at bedtime as needed (insomnia).  Marland Kitchen HYDROmorphone (DILAUDID) 2 MG tablet Take 1 tablet (2 mg total) by mouth every 6 (six) hours as needed for severe pain.  . nitrofurantoin (MACRODANTIN) 100 MG capsule TAKE 1 CAPSULE(100 MG) BY MOUTH DAILY  . temazepam (RESTORIL) 30 MG capsule Take 1 capsule (30 mg total) by mouth at bedtime as needed for sleep.  Marland Kitchen VITAMIN D PO Take 2,000 Units by mouth.   Social History   Tobacco Use  . Smoking status: Never Smoker  . Smokeless tobacco: Never Used  Substance Use Topics  . Alcohol use: Never   Family History  Problem Relation Age of Onset  . Early death Mother 49  . Transient ischemic attack Mother   . Depression Mother   . Mental illness Mother   . Transient ischemic attack Father   . Breast cancer Paternal Grandmother   . Breast cancer Paternal Aunt   . Breast cancer  Paternal Aunt   . Ehlers-Danlos syndrome Daughter      Review of Systems  Constitutional: Negative for chills, fatigue and fever.  Respiratory: Negative for cough, chest tightness, shortness of breath and wheezing.   Cardiovascular: Negative for chest pain, palpitations and leg swelling.  Musculoskeletal: Positive for back pain and gait problem (when pain flaired).  Neurological:       Neuropathic pain    Objective:  BP 92/60 (BP Location: Left Arm, Patient Position: Sitting, Cuff Size: Normal)   Pulse 96   Temp 98.2 F (36.8 C) (Oral)   Ht 5\' 4"  (1.626 m)   Wt 134 lb 4.8 oz (60.9 kg)   BMI 23.05 kg/m   Weight: 134 lb 4.8 oz (60.9 kg)   BP Readings from Last 3 Encounters:  09/27/20 92/60  06/26/20 115/76  06/16/19 100/74   Wt Readings from Last 3 Encounters:  09/27/20 134 lb 4.8 oz (60.9 kg)  06/26/20 126 lb 9.6 oz (57.4 kg)  05/11/20 125 lb (56.7 kg)    Physical Exam Constitutional:      General: She is not in acute distress.    Appearance: She is well-developed.  Cardiovascular:     Rate and Rhythm: Normal rate and regular rhythm.     Heart sounds: Normal heart sounds. No murmur heard. No friction rub.  Pulmonary:     Effort: Pulmonary effort is normal. No respiratory distress.     Breath sounds: Normal breath sounds. No wheezing or rales.  Musculoskeletal:     Right lower leg: No edema.     Left lower leg: No edema.  Neurological:     Mental Status: She is alert and oriented to person, place, and time.  Psychiatric:        Behavior: Behavior normal.     Assessment/Plan:  1. Irregular menstrual bleeding She has already seen gyn; has follow up uterine biopsy planned. I will follow along with this process. Additionally, we discussed that I will review her bloodwork from gyn and consider additional bloodwork pending this review.   2. Chronic midline low back pain without sciatica This has been chronic since failed back surgery. She has had a difficult  time finding relief from her pain and has a fear of further intervention (even injections) due to history of DVT/PE following surgery that also involved complicated recovery. We discussed doing some hematologic assessment for propensity to clot which we can add to future bloodwork (factor V, protein C/S, etc)  3. Lumbosacral radiculopathy See above. Currently her pain is managed with dilaudid from pain management. She also takes prednisone when she is in a pain flare which helps her. She will continue to try and space out prednisone use. She understands risks of chronic prednisone use including effects on bone density and blood sugar. She prefers taking prednisone orally versus injection due to fear of injections.  4. Failed back syndrome of lumbar spine See above.   5. Insomnia, unspecified type Secondary to pain. restoril does help with this. Was getting from psychiatry, but since this was only medication from them, I have agreed to write for this to help with cost of visits.  6. 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 She has figured out how to plan for days and pace herself with activities.      Return for pending gyn record review.  Theodis Shove, MD

## 2020-09-27 ENCOUNTER — Ambulatory Visit (INDEPENDENT_AMBULATORY_CARE_PROVIDER_SITE_OTHER): Payer: Medicare Other | Admitting: Family Medicine

## 2020-09-27 ENCOUNTER — Encounter: Payer: Self-pay | Admitting: Family Medicine

## 2020-09-27 VITALS — BP 92/60 | HR 96 | Temp 98.2°F | Ht 64.0 in | Wt 134.3 lb

## 2020-09-27 DIAGNOSIS — Z6822 Body mass index (BMI) 22.0-22.9, adult: Secondary | ICD-10-CM | POA: Diagnosis not present

## 2020-09-27 DIAGNOSIS — F419 Anxiety disorder, unspecified: Secondary | ICD-10-CM

## 2020-09-27 DIAGNOSIS — N939 Abnormal uterine and vaginal bleeding, unspecified: Secondary | ICD-10-CM | POA: Diagnosis not present

## 2020-09-27 DIAGNOSIS — Z1231 Encounter for screening mammogram for malignant neoplasm of breast: Secondary | ICD-10-CM | POA: Diagnosis not present

## 2020-09-27 DIAGNOSIS — Z01419 Encounter for gynecological examination (general) (routine) without abnormal findings: Secondary | ICD-10-CM | POA: Diagnosis not present

## 2020-09-27 DIAGNOSIS — M5417 Radiculopathy, lumbosacral region: Secondary | ICD-10-CM | POA: Diagnosis not present

## 2020-09-27 DIAGNOSIS — G8929 Other chronic pain: Secondary | ICD-10-CM

## 2020-09-27 DIAGNOSIS — M545 Low back pain, unspecified: Secondary | ICD-10-CM

## 2020-09-27 DIAGNOSIS — N926 Irregular menstruation, unspecified: Secondary | ICD-10-CM

## 2020-09-27 DIAGNOSIS — M961 Postlaminectomy syndrome, not elsewhere classified: Secondary | ICD-10-CM | POA: Diagnosis not present

## 2020-09-27 DIAGNOSIS — G4700 Insomnia, unspecified: Secondary | ICD-10-CM

## 2020-09-27 DIAGNOSIS — Z113 Encounter for screening for infections with a predominantly sexual mode of transmission: Secondary | ICD-10-CM | POA: Diagnosis not present

## 2020-09-27 LAB — HM PAP SMEAR
HM Pap smear: NEGATIVE
HM Pap smear: NORMAL

## 2020-09-27 LAB — HM MAMMOGRAPHY

## 2020-09-27 MED ORDER — PREDNISONE 20 MG PO TABS
ORAL_TABLET | ORAL | 2 refills | Status: DC
Start: 2020-09-27 — End: 2021-01-15

## 2020-09-27 NOTE — Patient Instructions (Signed)
Send me a mychart message if you haven't heard from me after I review your gynecology records - or you can update if you get more info.

## 2020-09-29 ENCOUNTER — Encounter: Payer: Self-pay | Admitting: Family Medicine

## 2020-10-01 ENCOUNTER — Encounter: Payer: Self-pay | Admitting: Family Medicine

## 2020-10-01 DIAGNOSIS — Z86718 Personal history of other venous thrombosis and embolism: Secondary | ICD-10-CM

## 2020-10-01 DIAGNOSIS — Z13220 Encounter for screening for lipoid disorders: Secondary | ICD-10-CM

## 2020-10-03 ENCOUNTER — Encounter: Payer: Self-pay | Admitting: Family Medicine

## 2020-10-03 ENCOUNTER — Other Ambulatory Visit: Payer: Self-pay

## 2020-10-03 MED ORDER — LIDOCAINE 5 % EX OINT: TOPICAL_OINTMENT | CUTANEOUS | 2 refills | Status: DC

## 2020-10-04 ENCOUNTER — Other Ambulatory Visit (INDEPENDENT_AMBULATORY_CARE_PROVIDER_SITE_OTHER): Payer: Medicare Other

## 2020-10-04 ENCOUNTER — Other Ambulatory Visit: Payer: Self-pay | Admitting: Psychiatry

## 2020-10-04 ENCOUNTER — Telehealth: Payer: Self-pay

## 2020-10-04 DIAGNOSIS — Z13220 Encounter for screening for lipoid disorders: Secondary | ICD-10-CM | POA: Diagnosis not present

## 2020-10-04 DIAGNOSIS — Z86718 Personal history of other venous thrombosis and embolism: Secondary | ICD-10-CM

## 2020-10-04 DIAGNOSIS — G4700 Insomnia, unspecified: Secondary | ICD-10-CM

## 2020-10-04 LAB — URINALYSIS, ROUTINE W REFLEX MICROSCOPIC
Bilirubin Urine: NEGATIVE
Ketones, ur: NEGATIVE
Leukocytes,Ua: NEGATIVE
Nitrite: NEGATIVE
Specific Gravity, Urine: <=1.005 — AB (ref 1.000–1.030)
Total Protein, Urine: NEGATIVE
Urine Glucose: NEGATIVE
Urobilinogen, UA: 0.2 (ref 0.0–1.0)
pH: 6 (ref 5.0–8.0)

## 2020-10-04 LAB — CBC WITH DIFFERENTIAL/PLATELET
Basophils Absolute: 0 10*3/uL (ref 0.0–0.1)
Basophils Relative: 1 % (ref 0.0–3.0)
Eosinophils Absolute: 0.2 10*3/uL (ref 0.0–0.7)
Eosinophils Relative: 3.4 % (ref 0.0–5.0)
HCT: 37.6 % (ref 36.0–46.0)
Hemoglobin: 13.1 g/dL (ref 12.0–15.0)
Lymphocytes Relative: 29 % (ref 12.0–46.0)
Lymphs Abs: 1.5 10*3/uL (ref 0.7–4.0)
MCHC: 34.7 g/dL (ref 30.0–36.0)
MCV: 98.9 fl (ref 78.0–100.0)
Monocytes Absolute: 0.4 10*3/uL (ref 0.1–1.0)
Monocytes Relative: 8.7 % (ref 3.0–12.0)
Neutro Abs: 2.9 10*3/uL (ref 1.4–7.7)
Neutrophils Relative %: 57.9 % (ref 43.0–77.0)
Platelets: 216 10*3/uL (ref 150.0–400.0)
RBC: 3.8 Mil/uL — ABNORMAL LOW (ref 3.87–5.11)
RDW: 12.4 % (ref 11.5–15.5)
WBC: 5 10*3/uL (ref 4.0–10.5)

## 2020-10-04 LAB — COMPREHENSIVE METABOLIC PANEL
ALT: 12 U/L (ref 0–35)
AST: 16 U/L (ref 0–37)
Albumin: 4.3 g/dL (ref 3.5–5.2)
Alkaline Phosphatase: 50 U/L (ref 39–117)
BUN: 12 mg/dL (ref 6–23)
CO2: 30 mEq/L (ref 19–32)
Calcium: 9.1 mg/dL (ref 8.4–10.5)
Chloride: 101 mEq/L (ref 96–112)
Creatinine, Ser: 1.02 mg/dL (ref 0.40–1.20)
GFR: 66.06 mL/min (ref >60.00)
Glucose, Bld: 92 mg/dL (ref 70–99)
Potassium: 3.9 mEq/L (ref 3.5–5.1)
Sodium: 136 mEq/L (ref 135–145)
Total Bilirubin: 1.1 mg/dL (ref 0.2–1.2)
Total Protein: 6.9 g/dL (ref 6.0–8.3)

## 2020-10-04 LAB — LIPID PANEL
Cholesterol: 210 mg/dL — ABNORMAL HIGH (ref 0–200)
HDL: 87.5 mg/dL (ref >39.00)
LDL Cholesterol: 98 mg/dL (ref 0–99)
NonHDL: 122.41
Total CHOL/HDL Ratio: 2
Triglycerides: 124 mg/dL (ref 0.0–149.0)
VLDL: 24.8 mg/dL (ref 0.0–40.0)

## 2020-10-04 LAB — PROTIME-INR
INR: 1.1 ratio — ABNORMAL HIGH (ref 0.8–1.0)
Prothrombin Time: 11.8 s (ref 9.6–13.1)

## 2020-10-04 LAB — SEDIMENTATION RATE: Sed Rate: 12 mm/hr (ref 0–20)

## 2020-10-04 LAB — APTT: aPTT: 36.6 s — ABNORMAL HIGH (ref 23.4–32.7)

## 2020-10-04 MED ORDER — CLONAZEPAM 0.5 MG PO TABS: 0.5000 mg | ORAL_TABLET | Freq: Every evening | ORAL | 0 refills | Status: DC | PRN

## 2020-10-04 NOTE — Addendum Note (Signed)
Addended by: Lerry Liner on: 10/04/2020 08:52 AM   Modules accepted: Orders

## 2020-10-04 NOTE — Telephone Encounter (Signed)
completed

## 2020-10-04 NOTE — Telephone Encounter (Signed)
Lauren called today to get a verbal authorization for this compound Rx.  [phone] extension [phone]

## 2020-10-04 NOTE — Telephone Encounter (Signed)
pt called states that she needs a refill on her klonopin 

## 2020-10-04 NOTE — Telephone Encounter (Signed)
Ordered for a month. According to the chart, her pcp may take over her prescription moving forward. Please contact the patient to see if that is the case. If not, please advise her to make a follow up appointment in April.

## 2020-10-08 LAB — PROTEIN S, TOTAL AND FUNCTIONAL PANEL
PROTEIN S ANTIGEN, TOTAL: 96 % normal (ref 70–140)
Protein S Activity: 85 % (ref 60–140)

## 2020-10-08 LAB — ANTI-NUCLEAR AB-TITER (ANA TITER)
ANA TITER: 1:80 {titer} — ABNORMAL HIGH
ANA Titer 1: 1:80 {titer} — ABNORMAL HIGH

## 2020-10-08 LAB — ANA: Anti Nuclear Antibody (ANA): POSITIVE — AB

## 2020-10-09 LAB — PROTHROMBIN ANTIBODY (IGG): Prothrombin Ab (IgG): <20 G units (ref <20)

## 2020-10-09 LAB — ANTIPHOSPHOLIPID SYNDROME DIAGNOSTIC PANEL
Anticardiolipin IgA: <2 APL-U/mL (ref <20.0)
Anticardiolipin IgG: <2 GPL-U/mL (ref <20.0)
Anticardiolipin IgM: 3.9 MPL-U/mL (ref <20.0)
Beta-2 Glyco 1 IgA: <2 U/mL (ref <20.0)
Beta-2 Glyco 1 IgM: 3 U/mL (ref <20.0)
Beta-2 Glyco I IgG: <2 U/mL (ref <20.0)
PTT-LA Screen: 40 s (ref <=40)
dRVVT: 38 s (ref <=45)

## 2020-10-09 LAB — PROTEIN C, TOTAL AND FUNCTIONAL PANEL
PROTEIN C, ANTIGEN: 94 % (ref 70–140)
Protein C Activity: 102 % (ref 70–180)

## 2020-10-09 LAB — P PHOSPHATIDYLSERINE (IGG, IGM)CY: PHOSPHATIDYLSERINE AB  (IGG): <10 U/mL (ref <10)

## 2020-10-09 LAB — ANTITHROMBIN III: AntiThromb III Func: 89 % normal (ref 80–135)

## 2020-10-11 LAB — FACTOR 5 LEIDEN: Result: NEGATIVE

## 2020-10-11 LAB — PROTHROMBIN GENE MUTATION: PROTHROMBIN (FACTOR II): POSITIVE — AB

## 2020-10-15 ENCOUNTER — Encounter: Payer: Self-pay | Admitting: Family Medicine

## 2020-10-15 DIAGNOSIS — N939 Abnormal uterine and vaginal bleeding, unspecified: Secondary | ICD-10-CM | POA: Diagnosis not present

## 2020-10-17 NOTE — Telephone Encounter (Signed)
See results note. 

## 2020-10-31 DIAGNOSIS — N761 Subacute and chronic vaginitis: Secondary | ICD-10-CM | POA: Diagnosis not present

## 2020-10-31 DIAGNOSIS — N939 Abnormal uterine and vaginal bleeding, unspecified: Secondary | ICD-10-CM | POA: Diagnosis not present

## 2020-11-12 DIAGNOSIS — M961 Postlaminectomy syndrome, not elsewhere classified: Secondary | ICD-10-CM | POA: Diagnosis not present

## 2020-11-12 DIAGNOSIS — Z79899 Other long term (current) drug therapy: Secondary | ICD-10-CM | POA: Diagnosis not present

## 2020-11-12 DIAGNOSIS — F1120 Opioid dependence, uncomplicated: Secondary | ICD-10-CM | POA: Diagnosis not present

## 2020-11-12 DIAGNOSIS — G894 Chronic pain syndrome: Secondary | ICD-10-CM | POA: Diagnosis not present

## 2020-11-12 DIAGNOSIS — Z6822 Body mass index (BMI) 22.0-22.9, adult: Secondary | ICD-10-CM | POA: Diagnosis not present

## 2020-11-15 ENCOUNTER — Other Ambulatory Visit: Payer: Self-pay | Admitting: Adult Health

## 2020-11-15 ENCOUNTER — Encounter: Payer: Self-pay | Admitting: Family Medicine

## 2020-11-15 DIAGNOSIS — R000 Tachycardia, unspecified: Secondary | ICD-10-CM

## 2020-11-28 ENCOUNTER — Encounter: Payer: Self-pay | Admitting: Family Medicine

## 2020-11-30 ENCOUNTER — Encounter: Payer: Self-pay | Admitting: Family Medicine

## 2020-11-30 DIAGNOSIS — G4700 Insomnia, unspecified: Secondary | ICD-10-CM

## 2020-12-02 MED ORDER — TEMAZEPAM 30 MG PO CAPS: 30.0000 mg | ORAL_CAPSULE | Freq: Every evening | ORAL | 0 refills | Status: DC | PRN

## 2020-12-31 DIAGNOSIS — Z6822 Body mass index (BMI) 22.0-22.9, adult: Secondary | ICD-10-CM | POA: Diagnosis not present

## 2020-12-31 DIAGNOSIS — G894 Chronic pain syndrome: Secondary | ICD-10-CM | POA: Diagnosis not present

## 2020-12-31 DIAGNOSIS — Z79899 Other long term (current) drug therapy: Secondary | ICD-10-CM | POA: Diagnosis not present

## 2020-12-31 DIAGNOSIS — M961 Postlaminectomy syndrome, not elsewhere classified: Secondary | ICD-10-CM | POA: Diagnosis not present

## 2020-12-31 DIAGNOSIS — F1120 Opioid dependence, uncomplicated: Secondary | ICD-10-CM | POA: Diagnosis not present

## 2021-01-01 ENCOUNTER — Encounter: Payer: Self-pay | Admitting: Family Medicine

## 2021-01-01 DIAGNOSIS — G4700 Insomnia, unspecified: Secondary | ICD-10-CM

## 2021-01-03 MED ORDER — TEMAZEPAM 30 MG PO CAPS
30.0000 mg | ORAL_CAPSULE | Freq: Every evening | ORAL | 2 refills | Status: DC | PRN
Start: 2021-01-03 — End: 2021-01-15

## 2021-01-15 ENCOUNTER — Telehealth (INDEPENDENT_AMBULATORY_CARE_PROVIDER_SITE_OTHER): Payer: Medicare Other | Admitting: Family Medicine

## 2021-01-15 DIAGNOSIS — G8929 Other chronic pain: Secondary | ICD-10-CM

## 2021-01-15 DIAGNOSIS — N390 Urinary tract infection, site not specified: Secondary | ICD-10-CM

## 2021-01-15 DIAGNOSIS — D682 Hereditary deficiency of other clotting factors: Secondary | ICD-10-CM

## 2021-01-15 DIAGNOSIS — R000 Tachycardia, unspecified: Secondary | ICD-10-CM

## 2021-01-15 DIAGNOSIS — M545 Low back pain, unspecified: Secondary | ICD-10-CM | POA: Diagnosis not present

## 2021-01-15 DIAGNOSIS — G4700 Insomnia, unspecified: Secondary | ICD-10-CM | POA: Diagnosis not present

## 2021-01-15 MED ORDER — NITROFURANTOIN MACROCRYSTAL 100 MG PO CAPS: ORAL_CAPSULE | ORAL | 3 refills | Status: DC

## 2021-01-15 MED ORDER — ACEBUTOLOL HCL 200 MG PO CAPS: ORAL_CAPSULE | ORAL | 1 refills | Status: DC

## 2021-01-15 MED ORDER — TEMAZEPAM 30 MG PO CAPS: 30.0000 mg | ORAL_CAPSULE | Freq: Every evening | ORAL | 1 refills | Status: DC | PRN

## 2021-01-15 MED ORDER — NORETHINDRONE 0.35 MG PO TABS: 1.0000 | ORAL_TABLET | Freq: Every day | ORAL | 1 refills | Status: DC

## 2021-01-15 MED ORDER — NORETHINDRONE 0.35 MG PO TABS: 1.0000 | ORAL_TABLET | Freq: Every day | ORAL | 11 refills | Status: DC

## 2021-01-15 MED ORDER — AMITRIPTYLINE HCL 25 MG PO TABS: ORAL_TABLET | ORAL | 1 refills | Status: DC

## 2021-01-15 MED ORDER — PREDNISONE 20 MG PO TABS
ORAL_TABLET | ORAL | 2 refills | Status: DC
Start: 2021-01-15 — End: 2022-08-31

## 2021-01-15 MED ORDER — CLONAZEPAM 0.5 MG PO TABS: 0.5000 mg | ORAL_TABLET | Freq: Every evening | ORAL | 0 refills | Status: DC | PRN

## 2021-01-15 NOTE — Progress Notes (Signed)
Virtual Visit via Video Note  I connected with Christie Hurst  on 01/15/21 at  1:30 PM EDT by a video enabled telemedicine application and verified that I am speaking with the correct person using two identifiers.  Location patient: home Location provider: [facility]  [address] [place], [place] [address] Persons participating in the virtual visit: patient, provider  I discussed the limitations of evaluation and management by telemedicine and the availability of in person appointments. The patient expressed understanding and agreed to proceed.   Christie Hurst DOB: [birth year] Encounter date: 01/15/2021  This is a 45 y.o. female who presents with No chief complaint on file.   History of present illness: She is moving to [place] at the end of this month; closing on 22nd. House being bought by [employer] so she will stay around to make sure that all goes through.   She just wanted to tie things up before she moves. This is the only part that she dreads with move. She was told by current pain management to wait for referral from PCP. Moving to suburb of [place].   Chronic back pain is stable. See prior note. She has had a flare since we last talked; happened while traveling. Prednisone helped.   Stable on other meds. Rare use klonopin to help with anxiety; sleeps well with temazepam (previously was getting through psychiatry, but has been stable on same dose for years and so I agreed to fill for her)  Allergies  Allergen Reactions  . Tamsulosin Hypertension, Photosensitivity, Rash and Shortness Of Breath  . Tramadol Anxiety, Palpitations and Shortness Of Breath  . Carbamazepine Diarrhea and Other (See Comments)  . Cephalexin Hives  . Duloxetine Hcl Other (See Comments)  . Lamotrigine Photosensitivity  . Levorphanol   . Remeron [Mirtazapine]     Chest pain  . Seroquel [Quetiapine Fumarate]     "out of it" it opposite of what it was suppose to do  .  Buprenorphine Hcl Anxiety and Palpitations  . Sulfamethoxazole-Trimethoprim Hives and Palpitations   Current Meds  Medication Sig  . [DISCONTINUED] norethindrone (NORLYDA) 0.35 MG tablet Take 1 tablet (0.35 mg total) by mouth daily.    Review of Systems  Constitutional: Negative for chills, fatigue and fever.  Respiratory: Negative for cough, chest tightness, shortness of breath and wheezing.   Cardiovascular: Negative for chest pain, palpitations and leg swelling.    Objective:  There were no vitals taken for this visit.      BP Readings from Last 3 Encounters:  09/27/20 92/60  06/26/20 115/76  06/16/19 100/74   Wt Readings from Last 3 Encounters:  09/27/20 134 lb 4.8 oz (60.9 kg)  06/26/20 126 lb 9.6 oz (57.4 kg)  05/11/20 125 lb (56.7 kg)    EXAM:  GENERAL: alert, oriented, appears well and in no acute distress  HEENT: atraumatic, conjunctiva clear, no obvious abnormalities on inspection of external nose and ears  NECK: normal movements of the head and neck  LUNGS: on inspection no signs of respiratory distress, breathing rate appears normal, no obvious gross SOB, gasping or wheezing  CV: no obvious cyanosis  MS: moves all visible extremities without noticeable abnormality.   PSYCH/NEURO: pleasant and cooperative, no obvious depression or anxiety, speech and thought processing grossly intact   Assessment/Plan  1. Insomnia, unspecified type Has been well controlled with temazepam.  - temazepam (RESTORIL) 30 MG capsule; Take 1 capsule (30 mg total) by mouth at bedtime as needed  for sleep.  Dispense: 90 capsule; Refill: 1 - clonazePAM (KLONOPIN) 0.5 MG tablet; Take 1 tablet (0.5 mg total) by mouth at bedtime as needed (insomnia).  Dispense: 30 tablet; Refill: 0 - amitriptyline (ELAVIL) 25 MG tablet; TAKE 1 TABLET(25 MG) BY MOUTH AT BEDTIME  Dispense: 90 tablet; Refill: 1  2. Inappropriate sinus tachycardia - acebutolol (SECTRAL) 200 MG capsule; TAKE 1  CAPSULE(200 MG) BY MOUTH DAILY  Dispense: 90 capsule; Refill: 1  3. Chronic midline low back pain without sciatica Uses prednisone just with flares. She will be establishing with pain medicine in [place]; she will see primary first to get referral.  - predniSONE (DELTASONE) 20 MG tablet; 60 mg x 3 days, 40 mg x 3 days, 20 mg x 3 days, 10 mg x 2 days  Dispense: 19 tablet; Refill: 2 - amitriptyline (ELAVIL) 25 MG tablet; TAKE 1 TABLET(25 MG) BY MOUTH AT BEDTIME  Dispense: 90 tablet; Refill: 1  4. 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   5. Frequent UTI Has done well on macrobid.  - nitrofurantoin (MACRODANTIN) 100 MG capsule; Take 1 capsule PO daily.  Dispense: 90 capsule; Refill: 3    Return for she is moving out of state.   I discussed the assessment and treatment plan with the patient. The patient was provided an opportunity to ask questions and all were answered. The patient agreed with the plan and demonstrated an understanding of the instructions.   The patient was advised to call back or seek an in-person evaluation if the symptoms worsen or if the condition fails to improve as anticipated.  I provided 30 minutes of non-face-to-face time during this encounter.   Theodis Shove, MD

## 2021-01-21 ENCOUNTER — Other Ambulatory Visit: Payer: Self-pay | Admitting: Adult Health

## 2021-01-21 DIAGNOSIS — M545 Low back pain, unspecified: Secondary | ICD-10-CM

## 2021-01-21 DIAGNOSIS — G4700 Insomnia, unspecified: Secondary | ICD-10-CM

## 2021-01-21 DIAGNOSIS — G8929 Other chronic pain: Secondary | ICD-10-CM

## 2021-01-21 NOTE — Telephone Encounter (Signed)
Message routed to PCP CMA  

## 2021-03-18 ENCOUNTER — Encounter: Payer: Self-pay | Admitting: Family Medicine

## 2021-05-03 IMAGING — MR MR LUMBAR SPINE WO/W CM
4 of 7 series · 24 of 48 positions shown · IV contrast (multihance)
Comparison: 09/28/2016 outside lumbar MRI. 07/23/2016 lumbar spine
CT.

CLINICAL DATA: Failed back syndrome

EXAM:
MRI LUMBAR SPINE WITHOUT AND WITH CONTRAST
TECHNIQUE: Multiplanar and multiecho pulse sequences of the lumbar spine were
obtained without and with intravenous contrast.
CONTRAST:  12mL MULTIHANCE GADOBENATE DIMEGLUMINE 529 MG/ML IV SOLN

[Series 2: T1 · sagittal · 4.0mm · 0.88mm/px · 5 of 15 slices shown (1 of 2)]
[im 1/15]
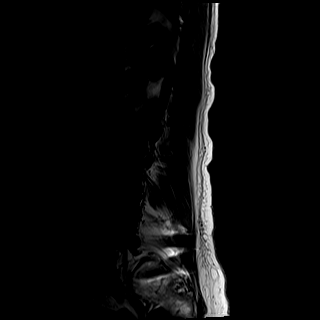
[im 4/15]
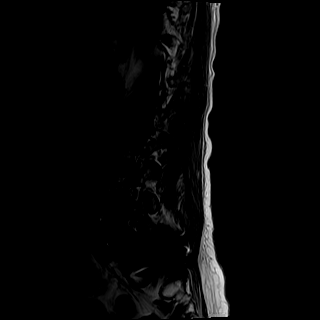
[im 8/15]
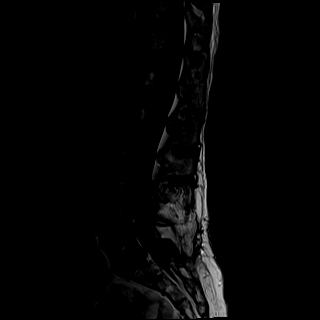
[im 11/15]
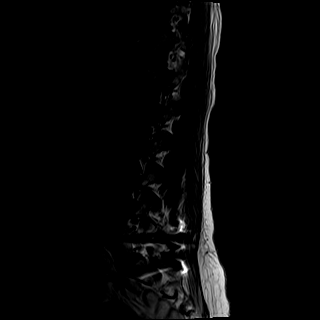
[im 15/15]
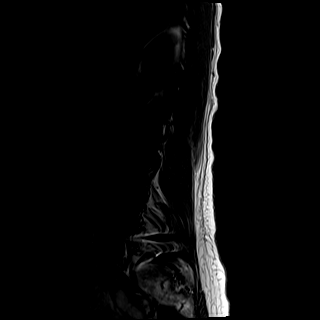

[Series 4: T2 · axial · 4.0mm · 0.39mm/px · z∈[-131,+52]mm · 8 of 37 slices shown (1 of 2)]
[im 1/37]
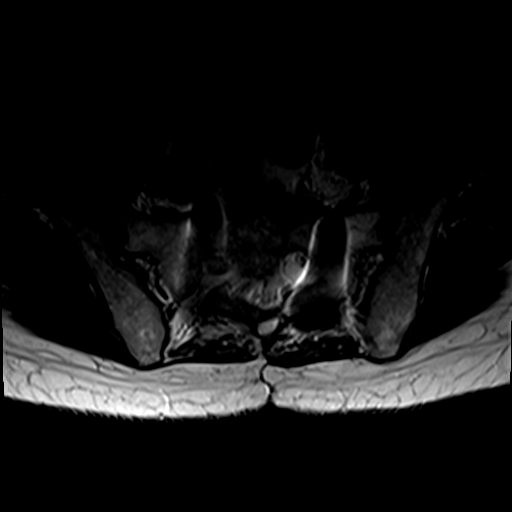
[im 5/37]
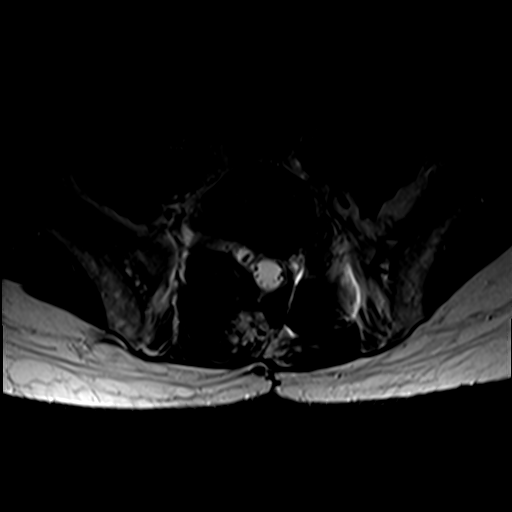
[im 13/37]
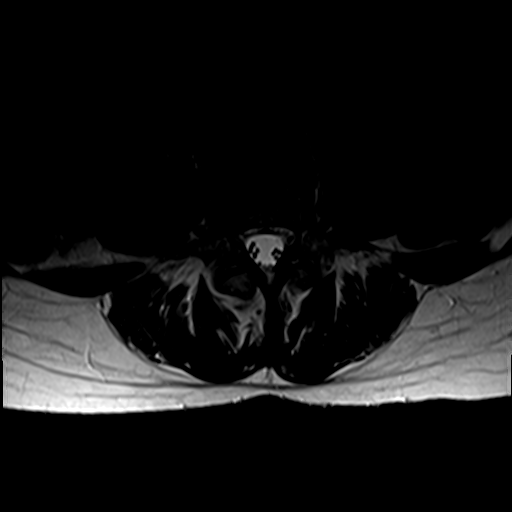
[im 17/37]
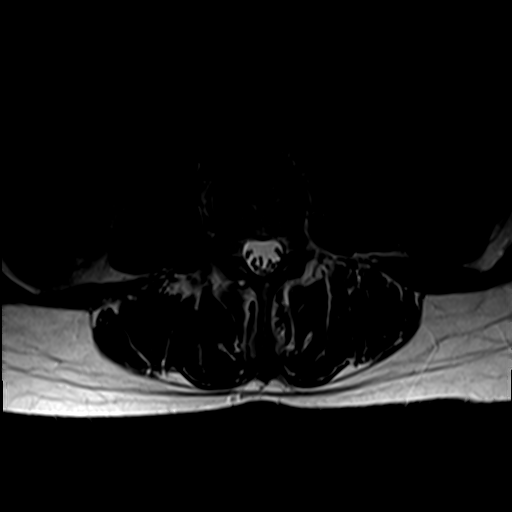
[im 21/37]
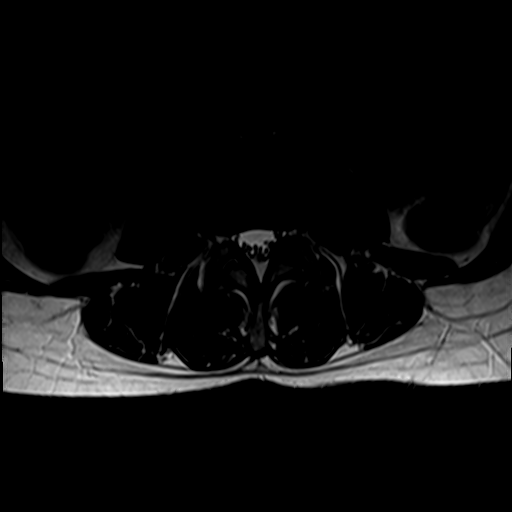
[im 25/37]
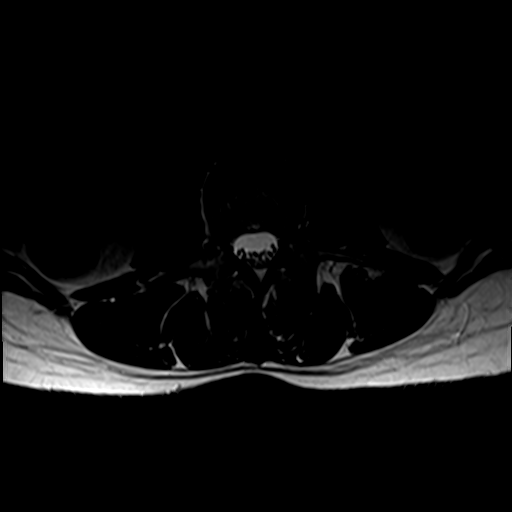
[im 33/37]
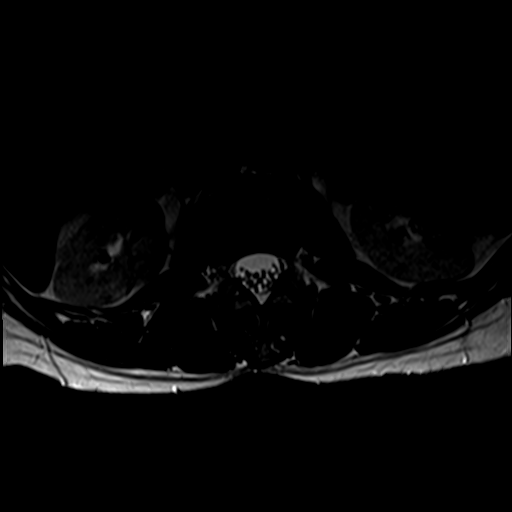
[im 37/37]
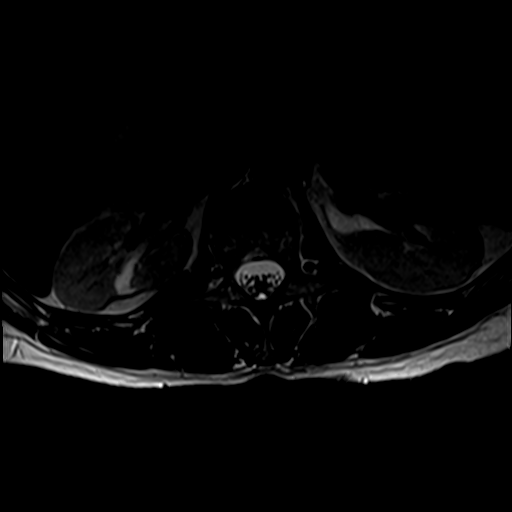

[Series 5: T1 · axial · 4.0mm · 0.39mm/px · z∈[-131,+33]mm · 7 of 37 slices shown (2 of 2)]
[im 1/37]
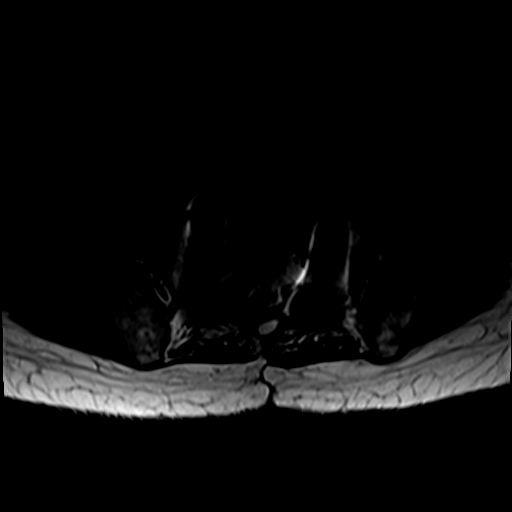
[im 5/37]
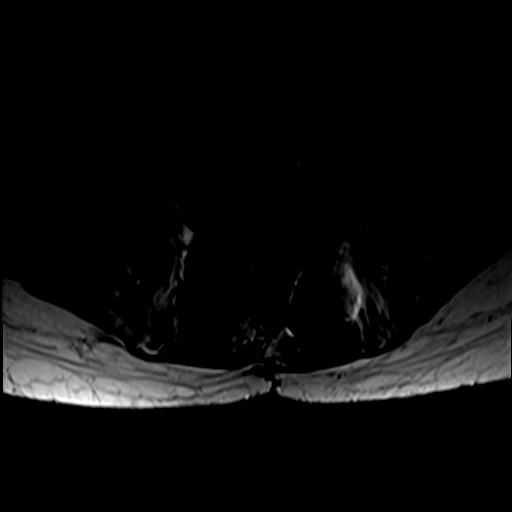
[im 13/37]
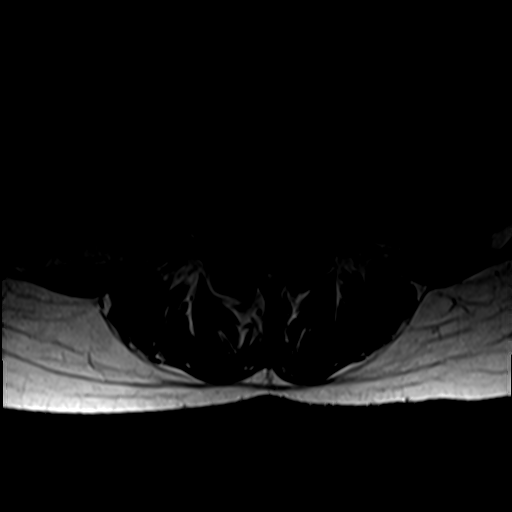
[im 17/37]
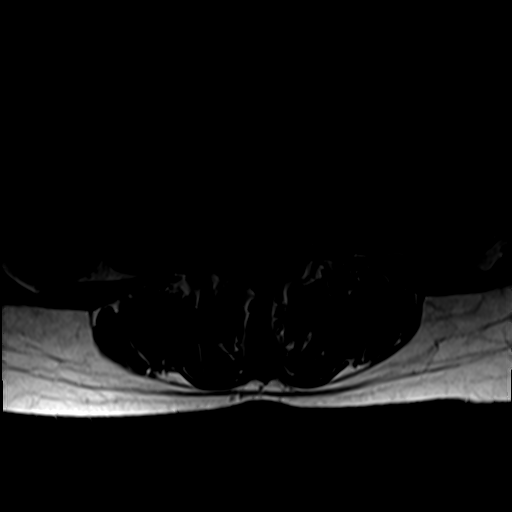
[im 21/37]
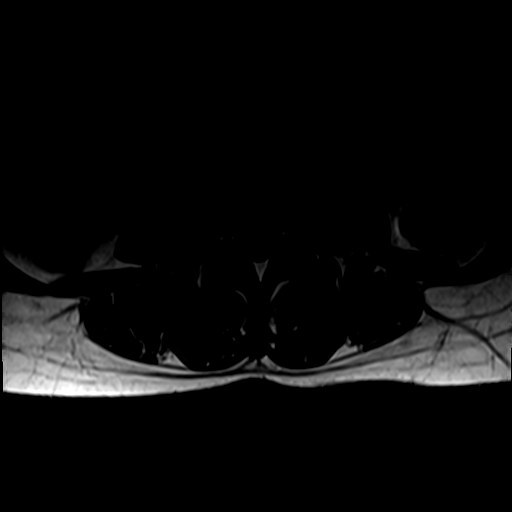
[im 25/37]
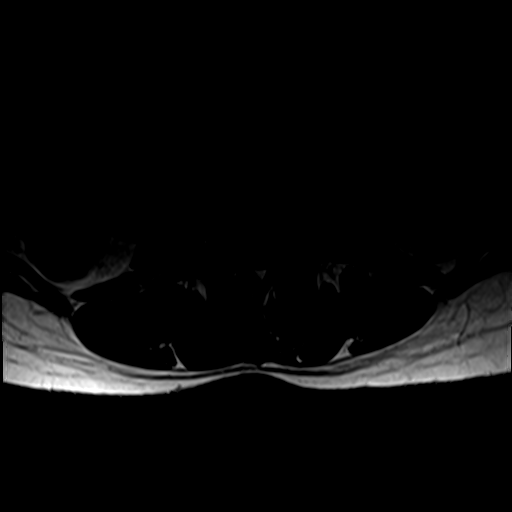
[im 33/37]
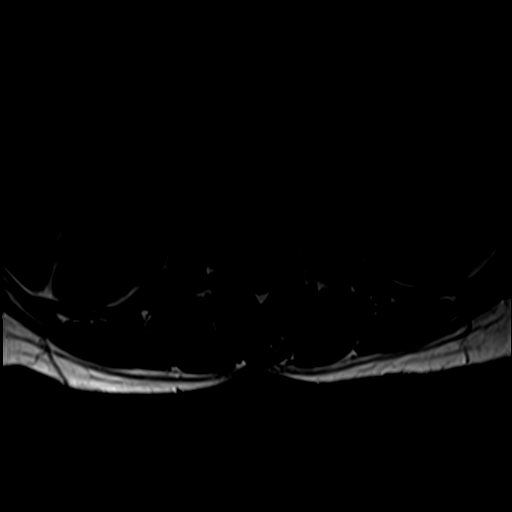

[Series 6: T2 · sagittal · 4.0mm · 1.09mm/px · 4 of 16 slices shown (2 of 2)]
[im 1/16]
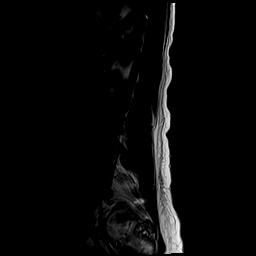
[im 6/16]
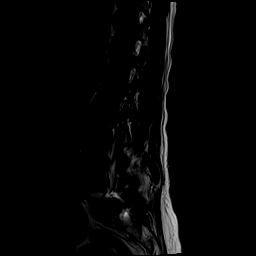
[im 11/16]
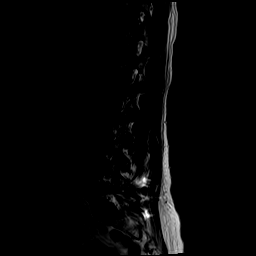
[im 16/16]
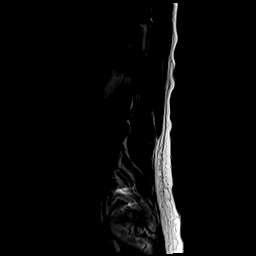

[24 of 48 positions shown; findings below may reference images not displayed]

FINDINGS: Segmentation: Standard lumbar numbering based on the available
coverage

Alignment:  Negative for listhesis.

Vertebrae: Postoperative changes described below. No fracture,
discitis, or inflammatory changes

Conus medullaris and cauda equina: Conus extends to the T12-L1
level. Conus and cauda equina appear normal.

Paraspinal and other soft tissues: Right upper pole renal cortical
scarring. Stented left common iliac vein.

Disc levels:

T12- L1: Unremarkable.

L1-L2: Unremarkable.

L2-L3: Mild disc narrowing and bulging without impingement

L3-L4: Disc narrowing and mild endplate degeneration with disc bulge
and endplate spurring. No impingement

L4-L5: Advanced degenerative disc collapse with mild endplate
ridging. Probable mild facet spurring. No neural impingement

L5-S1:Prior PLIF. The graft is left eccentric with adjacent residual
disc material, ridging, and incorporated bone graft which encroaches
on the left L5 foraminal nerve root, visualization degraded by
artifact from pedicle screws. By CT there appears to be patent
foramen posterior to this ossified material. The same material is
seen left paracentral and contacts the descending S1 nerve root
which is somewhat displaced posteriorly. The canal is patent.
IMPRESSION: 1. Stable from 5616.
2. L5-S1 PLIF with left eccentric intervertebral cage and bone graft
which encroaches on the left L5 foraminal nerve root and on the
descending left S1 nerve.
3. L3-4 and L4-5 disc degeneration without impingement.

## 2021-07-02 ENCOUNTER — Encounter: Payer: Self-pay | Admitting: Family Medicine

## 2021-07-24 ENCOUNTER — Telehealth: Payer: Self-pay | Admitting: Family Medicine

## 2021-07-24 NOTE — Telephone Encounter (Signed)
Spoke to patient to schedule Medicare Annual Wellness Visit (AWV) either virtually or in office.   Patient declined  not interested has lots of dr appts    Maureen Chatters 07/17/21 per [facility]  please schedule at anytime with [facility] Nurse Health Advisor 1 or 2   This should be a 45 minute visit.

## 2021-08-08 ENCOUNTER — Ambulatory Visit: Payer: Medicare Other | Admitting: Family Medicine

## 2021-08-20 ENCOUNTER — Ambulatory Visit: Payer: Medicare Other | Admitting: Family Medicine

## 2021-08-27 ENCOUNTER — Encounter: Payer: Self-pay | Admitting: Family Medicine

## 2021-08-27 DIAGNOSIS — R000 Tachycardia, unspecified: Secondary | ICD-10-CM

## 2021-08-27 DIAGNOSIS — N390 Urinary tract infection, site not specified: Secondary | ICD-10-CM

## 2021-08-27 DIAGNOSIS — G4700 Insomnia, unspecified: Secondary | ICD-10-CM

## 2021-08-27 DIAGNOSIS — G8929 Other chronic pain: Secondary | ICD-10-CM

## 2021-08-28 MED ORDER — ACEBUTOLOL HCL 200 MG PO CAPS: ORAL_CAPSULE | ORAL | 1 refills | Status: AC

## 2021-08-28 MED ORDER — NORETHINDRONE 0.35 MG PO TABS: 1.0000 | ORAL_TABLET | Freq: Every day | ORAL | 1 refills | Status: AC

## 2021-08-28 MED ORDER — AMITRIPTYLINE HCL 25 MG PO TABS: ORAL_TABLET | ORAL | 1 refills | Status: AC

## 2021-08-28 MED ORDER — TEMAZEPAM 30 MG PO CAPS: 30.0000 mg | ORAL_CAPSULE | Freq: Every evening | ORAL | 1 refills | Status: DC | PRN

## 2021-08-28 MED ORDER — NITROFURANTOIN MACROCRYSTAL 100 MG PO CAPS: ORAL_CAPSULE | ORAL | 1 refills | Status: AC

## 2021-09-02 ENCOUNTER — Encounter: Payer: Self-pay | Admitting: Family Medicine

## 2021-09-11 ENCOUNTER — Encounter: Payer: Self-pay | Admitting: Family Medicine

## 2021-09-27 ENCOUNTER — Other Ambulatory Visit: Payer: Self-pay | Admitting: Family Medicine

## 2021-09-27 DIAGNOSIS — M545 Low back pain, unspecified: Secondary | ICD-10-CM

## 2021-09-30 ENCOUNTER — Encounter: Payer: Self-pay | Admitting: Family Medicine

## 2022-01-29 NOTE — Progress Notes (Signed)
Virtual Visit via Video Note  I connected with Christie Hurst on 01/30/22 at 11:00 AM EDT by a video enabled telemedicine application and verified that I am speaking with the correct person using two identifiers.  Location: Patient: home Provider: office Persons participated in the visit- patient, provider    I discussed the limitations of evaluation and management by telemedicine and the availability of in person appointments. The patient expressed understanding and agreed to proceed.    I discussed the assessment and treatment plan with the patient. The patient was provided an opportunity to ask questions and all were answered. The patient agreed with the plan and demonstrated an understanding of the instructions.   The patient was advised to call back or seek an in-person evaluation if the symptoms worsen or if the condition fails to improve as anticipated.  I provided 32 minutes of non-face-to-face time during this encounter.   [name], MD    [facility] MD/PA/NP OP Progress Note  01/30/2022 12:14 PM Christie Hurst  MRN:  [id number]  Chief Complaint:  Chief Complaint  Patient presents with   Follow-up   HPI:  -She is not seen since Jan 2022 This is a follow-up appointment for insomnia.  She states that she is not doing well.  Her back pain has been worse.  She was told that the only option she has a spinal cord stimulator.  Although she goes to grocery, it takes up to 3 days for her to be able to do things due to significant pain.  Although she wants to get out and do things, she cannot do so due to this pain.  She cannot read a book due to difficulty in concentration, although she still enjoys reading medical journal/learning.  She feels blessing to have her daughter at home, who has a good sense of humor.  This is the only time she laughs.  Although she has fair relationship with her husband, he is also busy at work.  She states that she lost her father a few months ago.  He  was suffering from COPD.  Although she misses him, she knows that she was suffering.  She talks about the loss with her children, who are missing him more.  She denies feeling depressed.  She feels anxious at times.  Although she occasionally thinks "what is the point" due to her condition, she denies any plan or intent.  She agrees to contact emergency resources if needed.   Insomnia-she has significant worsening in and insomnia due to worsening pain.  She has middle insomnia with significant pain.  She states that she has been prescribed clonazepam temazepam by her PCP.  However, she had to see a new PCP as this provider had left the practice.  She wants this Clinical research associate to prescribe a refill of clonazepam as her new PCP does not feel comfortable prescribing this.  She states that she was taking clonazepam every day. When she is asked to look at the medication bottle, she could not find the recent bottle of clonazepam she filled (found the one from 2021 only).  She later states that she takes clonazepam only as needed as she knows that she will run out of her medication.  When she was asked about the discrepancy in her stated clonazepam use, she states that she had a half bottle of medication left, and has been using it every day due to significant pain lately.  She verbalized understanding to obtain record from her new PCP  first.  She denies alcohol use or drug use.  She is willing to try other hypnotics in the meantime.   Per PMP, clonazepam was filled in June 2022.   Visit Diagnosis:    ICD-10-CM   1. Insomnia, unspecified type  G47.00       Past Psychiatric History: Please see initial evaluation for full details. I have reviewed the history. No updates at this time.     Past Medical History:  Past Medical History:  Diagnosis Date   Chronic back pain    Frequent UTI    d/t congential abnormality    History of DVT (deep vein thrombosis) 05/2013   Inappropriate sinus tachycardia    Phlebitis     POTS (postural orthostatic tachycardia syndrome)    Tachycardia    Urethral spasm    UTI (urinary tract infection)     Past Surgical History:  Procedure Laterality Date   CESAREAN SECTION     x 2    LAMINECTOMY  12/2012   L5/S1   microdiscetomy  11/2012   L5/S1   SPINAL FUSION  03/2013   L5/S1   stent/DVT  05/2013   Left groin    Family Psychiatric History: Please see initial evaluation for full details. I have reviewed the history. No updates at this time.     Family History:  Family History  Problem Relation Age of Onset   Early death Mother 60   Transient ischemic attack Mother    Depression Mother    Mental illness Mother    Transient ischemic attack Father    Breast cancer Paternal Grandmother    Breast cancer Paternal Aunt    Breast cancer Paternal Aunt    Ehlers-Danlos syndrome Daughter     Social History:  Social History   Socioeconomic History   Marital status: Married    Spouse name: jamie   Number of children: 2   Years of education: Not on file   Highest education level: Bachelor's degree (e.g., BA, AB, BS)  Occupational History   Not on file  Tobacco Use   Smoking status: Never   Smokeless tobacco: Never  Vaping Use   Vaping Use: Never used  Substance and Sexual Activity   Alcohol use: Never   Drug use: Never   Sexual activity: Yes  Other Topics Concern   Not on file  Social History Narrative   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    Two children ( 15 & 17)    Social Determinants of Health   Financial Resource Strain: Low Risk  (06/09/2018)   Overall Financial Resource Strain (CARDIA)    Difficulty of Paying Living Expenses: Not hard at all  Food Insecurity: No Food Insecurity (06/09/2018)   Hunger Vital Sign    Worried About Running Out of Food in the Last Year: Never true    Ran Out of Food in the Last Year: Never true  Transportation Needs: No Transportation Needs (06/09/2018)   PRAPARE - Scientist, research (physical sciences) (Medical): No    Lack of Transportation (Non-Medical): No  Physical Activity: Inactive (06/22/2018)   Exercise Vital Sign    Days of Exercise per Week: 0 days    Minutes of Exercise per Session: 0 min  Stress: Stress Concern Present (06/09/2018)   [employer] of Occupational Health - Occupational Stress Questionnaire    Feeling of Stress : Very much  Social Connections: Unknown (06/09/2018)   Social Connection and  Isolation Panel [NHANES]    Frequency of Communication with Friends and Family: Not on file    Frequency of Social Gatherings with Friends and Family: Not on file    Attends Religious Services: Never    Active Member of Clubs or Organizations: No    Attends Banker Meetings: Never    Marital Status: Married    Allergies:  Allergies  Allergen Reactions   Tamsulosin Hypertension, Photosensitivity, Rash and Shortness Of Breath   Tramadol Anxiety, Palpitations and Shortness Of Breath   Carbamazepine Diarrhea and Other (See Comments)   Cephalexin Hives   Duloxetine Hcl Other (See Comments)   Lamotrigine Photosensitivity   Levorphanol    Remeron [Mirtazapine]     Chest pain   Seroquel [Quetiapine Fumarate]     "out of it" it opposite of what it was suppose to do   Buprenorphine Hcl Anxiety and Palpitations   Sulfamethoxazole-Trimethoprim Hives and Palpitations    Metabolic Disorder Labs: Lab Results  Component Value Date   HGBA1C 5.2 06/16/2019   No results found for: "PROLACTIN" Lab Results  Component Value Date   CHOL 210 (H) 10/04/2020   TRIG 124.0 10/04/2020   HDL 87.50 10/04/2020   CHOLHDL 2 10/04/2020   VLDL 24.8 10/04/2020   LDLCALC 98 10/04/2020   LDLCALC 136 (H) 06/16/2019   Lab Results  Component Value Date   TSH 0.73 06/16/2019    Therapeutic Level Labs: No results found for: "LITHIUM" No results found for: "VALPROATE" No results found for: "CBMZ"  Current Medications: Current Outpatient Medications   Medication Sig Dispense Refill   doxepin (SINEQUAN) 10 MG capsule Take 1 capsule (10 mg total) by mouth at bedtime as needed (insomnia). 30 capsule 0   acebutolol (SECTRAL) 200 MG capsule TAKE 1 CAPSULE(200 MG) BY MOUTH DAILY 90 capsule 1   amitriptyline (ELAVIL) 25 MG tablet TAKE 1 TABLET(25 MG) BY MOUTH AT BEDTIME 90 tablet 1   aspirin 325 MG tablet Take 325 mg by mouth daily.     CALCIUM PO Take by mouth daily.     clonazePAM (KLONOPIN) 0.5 MG tablet Take 1 tablet (0.5 mg total) by mouth at bedtime as needed (insomnia). 30 tablet 0   HYDROmorphone (DILAUDID) 2 MG tablet Take 1 tablet (2 mg total) by mouth every 6 (six) hours as needed for severe pain. 20 tablet 0   nitrofurantoin (MACRODANTIN) 100 MG capsule Take 1 capsule PO daily. 90 capsule 1   norethindrone (NORLYDA) 0.35 MG tablet Take 1 tablet (0.35 mg total) by mouth daily. 84 tablet 1   predniSONE (DELTASONE) 20 MG tablet 60 mg x 3 days, 40 mg x 3 days, 20 mg x 3 days, 10 mg x 2 days 19 tablet 2   temazepam (RESTORIL) 30 MG capsule Take 1 capsule (30 mg total) by mouth at bedtime as needed for sleep. 90 capsule 1   VITAMIN D PO Take 2,000 Units by mouth.     No current facility-administered medications for this visit.     Musculoskeletal: Strength & Muscle Tone:  N/A Gait & Station:  N/A Patient leans: N/A  Psychiatric Specialty Exam: Review of Systems  Psychiatric/Behavioral:  Positive for decreased concentration, dysphoric mood and sleep disturbance. Negative for agitation, behavioral problems, confusion, hallucinations, self-injury and suicidal ideas. The patient is nervous/anxious. The patient is not hyperactive.   All other systems reviewed and are negative.   There were no vitals taken for this visit.There is no height or weight on file to  calculate BMI.  General Appearance: Fairly Groomed  Eye Contact:  Good  Speech:  Clear and Coherent  Volume:  Normal  Mood:  Anxious  Affect:  Appropriate, Congruent, and  slightly tense  Thought Process:  Coherent  Orientation:  Full (Time, Place, and Person)  Thought Content: Logical   Suicidal Thoughts:  No  Homicidal Thoughts:  No  Memory:  Immediate;   Good  Judgement:  Good  Insight:  Good  Psychomotor Activity:  Normal  Concentration:  Concentration: Good and Attention Span: Good  Recall:  Good  Fund of Knowledge: Good  Language: Good  Akathisia:  No  Handed:  Right  AIMS (if indicated): not done  Assets:  Communication Skills Desire for Improvement  ADL's:  Intact  Cognition: WNL  Sleep:  Poor   Screenings: [id number]    Flowsheet Row [facility]  PHQ-2 Total Score 0        Assessment and Plan:  Narda Amberlisa E Esselman is a 46 y.o. year old female with a history of insomnia, chronic pain syndrome, multiple back surgeries with complications, urethral spasm, congenital anomaly of the ureter, inappropriate tachycardia, who presents for follow up appointment for below.   1. Insomnia, unspecified type She reports significant worsening in insomnia in the context of worsening in back pain over the past several months.  Other psychosocial stressors includes loss of her father with COPD a few months ago.  She enjoys interaction with her daughter at home.  Although she asks clonazepam to be filled again as her new PCP does not order this, there has been some discrepancy in her clonazepam use during the visit.  Will obtain records from her new PCP.  Discussed with the patient regarding significant side effect of respiratory suppression with concomitant use of temazepam, opioid and clonazepam.  She agrees to hold clonazepam at this time, and try doxepin for insomnia.  Discussed potential risk of drowsiness, dry mouth.    Plan Continue temazepam 30 mg at night as needed for insomnia Start doxepin 10 mg at night as needed for insomnia Obtain record from per PCP Next appointment: 7/14 at 9:30 for 30 mins,  video - hold clonazepam. Review from her PCP is required for appropriate prescription  - on amitriptyline 25 mg at night (limited benefit from higher dose; prescribed by urology) - on hydromorphone 2 mg up to tid prn    Past trials of medication: citalopram (jaw stiffness), sertraline (restless), fluoxetine (restless), duloxetine, mirtazapine (chest pain), Quetiapine- "sick," Baclofen- crazy dreams, hydroxyzine (hyperactive), gabapentin (drowsiness), melatonin, Ambien (middle insomnia),  trazodone, temazepam, clonazepam, diazepam     The patient demonstrates the following risk factors for suicide: Chronic risk factors for suicide include: psychiatric disorder of anxiety and chronic pain. Acute risk factors for suicide include: family or marital conflict and unemployment. Protective factors for this patient include: positive social support, responsibility to others (children, family), coping skills and hope for the future. Considering these factors, the overall suicide risk at this point appears to be low. Patient is appropriate for outpatient follow up.  Collaboration of Care: Collaboration of Care: Other N/A  Patient/Guardian was advised Release of Information must be obtained prior to any record release in order to collaborate their care with an outside provider. Patient/Guardian was advised if they have not already done so to contact the registration department to sign all necessary forms in order for us to release information regarding their care.   Consent: Patient/Guardian gives  verbal consent for treatment and assignment of benefits for services provided during this visit. Patient/Guardian expressed understanding and agreed to proceed.    [name], MD 01/30/2022, 12:14 PM

## 2022-01-30 ENCOUNTER — Telehealth (INDEPENDENT_AMBULATORY_CARE_PROVIDER_SITE_OTHER): Payer: Medicare Other | Admitting: Psychiatry

## 2022-01-30 ENCOUNTER — Encounter: Payer: Self-pay | Admitting: Psychiatry

## 2022-01-30 DIAGNOSIS — G4700 Insomnia, unspecified: Secondary | ICD-10-CM

## 2022-01-30 MED ORDER — DOXEPIN HCL 10 MG PO CAPS: 10.0000 mg | ORAL_CAPSULE | Freq: Every evening | ORAL | 0 refills | Status: DC | PRN

## 2022-02-03 ENCOUNTER — Telehealth: Payer: Self-pay

## 2022-02-03 NOTE — Telephone Encounter (Signed)
pt called states that the last visit the medication you put her on she had side effect with it and stopped it.

## 2022-02-03 NOTE — Telephone Encounter (Signed)
called patient to find out what medication it was it was the doxepin.  she states that it kept her up and made her heart race

## 2022-02-03 NOTE — Telephone Encounter (Signed)
spoke with patient she states she did not want to try anything else that everything she tries it causes issues.

## 2022-02-03 NOTE — Telephone Encounter (Signed)
Noted. If she is interested in ramelteon 8 mg for insomnia, we can try. Let me know if she is interested. Side effects including drowsiness.

## 2022-02-23 NOTE — Progress Notes (Signed)
Virtual Visit via Video Note  I connected with Christie Hurst on 02/27/22 at  9:30 AM EDT by a video enabled telemedicine application and verified that I am speaking with the correct person using two identifiers.  Location: Patient: home Provider: office Persons participated in the visit- patient, provider    I discussed the limitations of evaluation and management by telemedicine and the availability of in person appointments. The patient expressed understanding and agreed to proceed.      I discussed the assessment and treatment plan with the patient. The patient was provided an opportunity to ask questions and all were answered. The patient agreed with the plan and demonstrated an understanding of the instructions.   The patient was advised to call back or seek an in-person evaluation if the symptoms worsen or if the condition fails to improve as anticipated.  I provided 15 minutes of non-face-to-face time during this encounter.   [name], MD    [facility] MD/PA/NP OP Progress Note  02/27/2022 10:40 AM Christie Hurst  MRN:  [id number]  Chief Complaint:  Chief Complaint  Patient presents with   Follow-up   HPI:  This is a follow-up appointment for insomnia.  She states that she has a good week today as there was only 1 night she had worsening in pain.  Although she was able to talk with her provider about a spinal cord stimulator, she was advised not to try as it can worsen her pain.  Although she did not want to have this surgery, she feels discouraged, and be back to square 1.  She tends to have worsening in pain at night when she does something during the day.  She talks about an example of her sitting in the chair for a few hours in the pool.  She would not do those things if she is physically able.  It has been also hard financially, although her husband was recently promoted.  She has insomnia when there is a worsening in pain. She sleeps 7 hours when she has minimal pain.   Although she did try doxepin, it caused palpitation, and frequent nighttime awakenings.  She does not want to try any other medication as she usually has similar side effects.  She also states that although she was recommended long-acting opioid, she does not want to pursue this as she also had adverse reaction in the past.  She did send a request PCP record.  She verbalized understanding that clonazepam will not be started until reviewing this record.  She denies alcohol use or drug use.    Visit Diagnosis:    ICD-10-CM   1. Insomnia, unspecified type  G47.00       Past Psychiatric History: Please see initial evaluation for full details. I have reviewed the history. No updates at this time.     Past Medical History:  Past Medical History:  Diagnosis Date   Chronic back pain    Frequent UTI    d/t congential abnormality    History of DVT (deep vein thrombosis) 05/2013   Inappropriate sinus tachycardia    Phlebitis    POTS (postural orthostatic tachycardia syndrome)    Tachycardia    Urethral spasm    UTI (urinary tract infection)     Past Surgical History:  Procedure Laterality Date   CESAREAN SECTION     x 2    LAMINECTOMY  12/2012   L5/S1   microdiscetomy  11/2012   L5/S1   SPINAL FUSION  03/2013   L5/S1   stent/DVT  05/2013   Left groin    Family Psychiatric History: Please see initial evaluation for full details. I have reviewed the history. No updates at this time.     Family History:  Family History  Problem Relation Age of Onset   Early death Mother 43   Transient ischemic attack Mother    Depression Mother    Mental illness Mother    Transient ischemic attack Father    Breast cancer Paternal Grandmother    Breast cancer Paternal Aunt    Breast cancer Paternal Aunt    Ehlers-Danlos syndrome Daughter     Social History:  Social History   Socioeconomic History   Marital status: Married    Spouse name: jamie   Number of children: 2   Years of  education: Not on file   Highest education level: Bachelor's degree (e.g., BA, AB, BS)  Occupational History   Not on file  Tobacco Use   Smoking status: Never   Smokeless tobacco: Never  Vaping Use   Vaping Use: Never used  Substance and Sexual Activity   Alcohol use: Never   Drug use: Never   Sexual activity: Yes  Other Topics Concern   Not on file  Social History Narrative   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    Two children ( 15 & 39)    Social Determinants of Health   Financial Resource Strain: Low Risk  (06/09/2018)   Overall Financial Resource Strain (CARDIA)    Difficulty of Paying Living Expenses: Not hard at all  Food Insecurity: No Food Insecurity (06/09/2018)   Hunger Vital Sign    Worried About Running Out of Food in the Last Year: Never true    Ran Out of Food in the Last Year: Never true  Transportation Needs: No Transportation Needs (06/09/2018)   PRAPARE - Administrator, Civil Service (Medical): No    Lack of Transportation (Non-Medical): No  Physical Activity: Inactive (06/22/2018)   Exercise Vital Sign    Days of Exercise per Week: 0 days    Minutes of Exercise per Session: 0 min  Stress: Stress Concern Present (06/09/2018)   [employer] of Occupational Health - Occupational Stress Questionnaire    Feeling of Stress : Very much  Social Connections: Unknown (06/09/2018)   Social Connection and Isolation Panel [NHANES]    Frequency of Communication with Friends and Family: Not on file    Frequency of Social Gatherings with Friends and Family: Not on file    Attends Religious Services: Never    Active Member of Clubs or Organizations: No    Attends Banker Meetings: Never    Marital Status: Married    Allergies:  Allergies  Allergen Reactions   Tamsulosin Hypertension, Photosensitivity, Rash and Shortness Of Breath   Tramadol Anxiety, Palpitations and Shortness Of Breath   Carbamazepine Diarrhea and Other (See  Comments)   Cephalexin Hives   Duloxetine Hcl Other (See Comments)   Lamotrigine Photosensitivity   Levorphanol    Remeron [Mirtazapine]     Chest pain   Seroquel [Quetiapine Fumarate]     "out of it" it opposite of what it was suppose to do   Buprenorphine Hcl Anxiety and Palpitations   Sulfamethoxazole-Trimethoprim Hives and Palpitations    Metabolic Disorder Labs: Lab Results  Component Value Date   HGBA1C 5.2 06/16/2019   No results found for: "PROLACTIN" Lab Results  Component Value Date   CHOL 210 (H) 10/04/2020   TRIG 124.0 10/04/2020   HDL 87.50 10/04/2020   CHOLHDL 2 10/04/2020   VLDL 24.8 10/04/2020   LDLCALC 98 10/04/2020   LDLCALC 136 (H) 06/16/2019   Lab Results  Component Value Date   TSH 0.73 06/16/2019    Therapeutic Level Labs: No results found for: "LITHIUM" No results found for: "VALPROATE" No results found for: "CBMZ"  Current Medications: Current Outpatient Medications  Medication Sig Dispense Refill   acebutolol (SECTRAL) 200 MG capsule TAKE 1 CAPSULE(200 MG) BY MOUTH DAILY 90 capsule 1   amitriptyline (ELAVIL) 25 MG tablet TAKE 1 TABLET(25 MG) BY MOUTH AT BEDTIME 90 tablet 1   aspirin 325 MG tablet Take 325 mg by mouth daily.     CALCIUM PO Take by mouth daily.     clonazePAM (KLONOPIN) 0.5 MG tablet Take 1 tablet (0.5 mg total) by mouth at bedtime as needed (insomnia). 30 tablet 0   HYDROmorphone (DILAUDID) 2 MG tablet Take 1 tablet (2 mg total) by mouth every 6 (six) hours as needed for severe pain. 20 tablet 0   nitrofurantoin (MACRODANTIN) 100 MG capsule Take 1 capsule PO daily. 90 capsule 1   norethindrone (NORLYDA) 0.35 MG tablet Take 1 tablet (0.35 mg total) by mouth daily. 84 tablet 1   predniSONE (DELTASONE) 20 MG tablet 60 mg x 3 days, 40 mg x 3 days, 20 mg x 3 days, 10 mg x 2 days 19 tablet 2   temazepam (RESTORIL) 30 MG capsule Take 1 capsule (30 mg total) by mouth at bedtime as needed for sleep. 90 capsule 1   VITAMIN D PO Take  2,000 Units by mouth.     No current facility-administered medications for this visit.     Musculoskeletal: Strength & Muscle Tone:  N/A Gait & Station:  N/A Patient leans: N/A  Psychiatric Specialty Exam: Review of Systems  Psychiatric/Behavioral:  Positive for sleep disturbance. Negative for agitation, behavioral problems, confusion, decreased concentration, dysphoric mood, hallucinations, self-injury and suicidal ideas. The patient is not nervous/anxious and is not hyperactive.   All other systems reviewed and are negative.   There were no vitals taken for this visit.There is no height or weight on file to calculate BMI.  General Appearance: Fairly Groomed  Eye Contact:  Good  Speech:  Clear and Coherent  Volume:  Normal  Mood:   better  Affect:  Appropriate, Congruent, and calm  Thought Process:  Coherent  Orientation:  Full (Time, Place, and Person)  Thought Content: Logical   Suicidal Thoughts:  No  Homicidal Thoughts:  No  Memory:  Immediate;   Good  Judgement:  Good  Insight:  Good  Psychomotor Activity:  Normal  Concentration:  Concentration: Good and Attention Span: Good  Recall:  Good  Fund of Knowledge: Good  Language: Good  Akathisia:  No  Handed:  Right  AIMS (if indicated): not done  Assets:  Communication Skills Desire for Improvement  ADL's:  Intact  Cognition: WNL  Sleep:  Fair   Screenings: Oceanographer Row Office Visit from 06/26/2020 in [facility] at [facility] Total Score 0        Assessment and Plan:  Christie Hurst is a 46 y.o. year old female with a history of  insomnia, chronic pain syndrome, multiple back surgeries with complications, urethral spasm, congenital anomaly of the ureter, inappropriate tachycardia, who presents for follow up appointment for below.  1. Insomnia, unspecified type Unstable.  She continues to have insomnia due to worsening in back pain. Psychosocial stressors includes loss of her  father with COPD a few months ago.  She enjoys interaction with her daughter at home.  She had adverse reaction from doxepin.  Currently awaiting records from the PCP to safely restart clonazepam as needed for insomnia (due to some inconsistency in its use at the last visit, although she has no known history of misuse of the medication), which has worked in the past.  Noted that although she will benefit from an adjustment in her pain medication given insomnia is primarily due to pain, she reportedly had adverse reaction from other medication, and she is not considering any change in the regimen. She is also not interested in trying any other hynotics. Will continue temazepam as needed at this time for insomnia.  She is aware of its potential risk of respiratory suppression with concomitant use of opioid.    Plan Continue temazepam 30 mg at night as needed for insomnia Discontinue doxepin Obtain record from per PCP. Request was sent.  Next appointment: 8/18 at 9 AM, video - hold clonazepam. Review from her PCP is required for appropriate prescription   - on amitriptyline 25 mg at night (limited benefit from higher dose; prescribed by urology) - on hydromorphone 2 mg up to tid prn    Past trials of medication: citalopram (jaw stiffness), sertraline (restless), fluoxetine (restless), duloxetine, mirtazapine (chest pain), Quetiapine- "sick," Baclofen (crazy dreams), hydroxyzine (hyperactive), gabapentin (drowsiness), melatonin, Ambien (middle insomnia), doxepin (palpitation/insomnia),   trazodone, temazepam, clonazepam, diazepam      Collaboration of Care: Collaboration of Care: Other N/A  Patient/Guardian was advised Release of Information must be obtained prior to any record release in order to collaborate their care with an outside provider. Patient/Guardian was advised if they have not already done so to contact the registration department to sign all necessary forms in order for [place] to release  information regarding their care.   Consent: Patient/Guardian gives verbal consent for treatment and assignment of benefits for services provided during this visit. Patient/Guardian expressed understanding and agreed to proceed.    [name], MD 02/27/2022, 10:40 AM

## 2022-02-27 ENCOUNTER — Encounter: Payer: Self-pay | Admitting: Psychiatry

## 2022-02-27 ENCOUNTER — Telehealth (INDEPENDENT_AMBULATORY_CARE_PROVIDER_SITE_OTHER): Payer: Medicare Other | Admitting: Psychiatry

## 2022-02-27 DIAGNOSIS — G4700 Insomnia, unspecified: Secondary | ICD-10-CM

## 2022-02-27 NOTE — Patient Instructions (Signed)
Continue temazepam 30 mg at night as needed for insomnia Discontinue doxepin Next appointment: 8/18 at 9 AM

## 2022-04-01 NOTE — Progress Notes (Signed)
Virtual Visit via Video Note  I connected with Christie Hurst on 04/03/22 at  9:00 AM EDT by a video enabled telemedicine application and verified that I am speaking with the correct person using two identifiers.  Location: Patient: home Provider: office Persons participated in the visit- patient, provider    I discussed the limitations of evaluation and management by telemedicine and the availability of in person appointments. The patient expressed understanding and agreed to proceed.     I discussed the assessment and treatment plan with the patient. The patient was provided an opportunity to ask questions and all were answered. The patient agreed with the plan and demonstrated an understanding of the instructions.   The patient was advised to call back or seek an in-person evaluation if the symptoms worsen or if the condition fails to improve as anticipated.  I provided 12 minutes of non-face-to-face time during this encounter.   [name], MD    [facility] MD/PA/NP OP Progress Note  04/03/2022 10:21 AM Christie Hurst  MRN:  [id number]  Chief Complaint:  Chief Complaint  Patient presents with   Follow-up   HPI:  This is a follow-up appointment for insomnia.  She states that she is not doing well.  She feels exhausted due to insomnia.  She has middle insomnia through the night due to pain.  She will see her pain provider every few months.  She has been able to enjoy going to shopping with her daughter.  She feels worse when she is by herself.  She feels disappointed that not having a fulfilling life she wanted to have.  She feels more stressed when she has pain.  She can be irritable.  She denies SI, HI, hallucinations, paranoia.  Although she feels wary about trying new medication due to previous side effect from other medication, she is willing to try lemborexant at this time.  She denies alcohol use or drug use.  She states that she is sent ROI to her PCP at least a few weeks  ago. She is willing to send the form to the clinic this time.   Visit Diagnosis:    ICD-10-CM   1. Insomnia, unspecified type  G47.00       Past Psychiatric History: Please see initial evaluation for full details. I have reviewed the history. No updates at this time.     Past Medical History:  Past Medical History:  Diagnosis Date   Chronic back pain    Frequent UTI    d/t congential abnormality    History of DVT (deep vein thrombosis) 05/2013   Inappropriate sinus tachycardia    Phlebitis    POTS (postural orthostatic tachycardia syndrome)    Tachycardia    Urethral spasm    UTI (urinary tract infection)     Past Surgical History:  Procedure Laterality Date   CESAREAN SECTION     x 2    LAMINECTOMY  12/2012   L5/S1   microdiscetomy  11/2012   L5/S1   SPINAL FUSION  03/2013   L5/S1   stent/DVT  05/2013   Left groin    Family Psychiatric History: Please see initial evaluation for full details. I have reviewed the history. No updates at this time.     Family History:  Family History  Problem Relation Age of Onset   Early death Mother 39   Transient ischemic attack Mother    Depression Mother    Mental illness Mother    Transient ischemic  attack Father    Breast cancer Paternal Grandmother    Breast cancer Paternal Aunt    Breast cancer Paternal Aunt    Ehlers-Danlos syndrome Daughter     Social History:  Social History   Socioeconomic History   Marital status: Married    Spouse name: jamie   Number of children: 2   Years of education: Not on file   Highest education level: Bachelor's degree (e.g., BA, AB, BS)  Occupational History   Not on file  Tobacco Use   Smoking status: Never   Smokeless tobacco: Never  Vaping Use   Vaping Use: Never used  Substance and Sexual Activity   Alcohol use: Never   Drug use: Never   Sexual activity: Yes  Other Topics Concern   Not on file  Social History 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 children ( 15 & 78)    Social Determinants of Health   Financial Resource Strain: Low Risk  (06/09/2018)   Overall Financial Resource Strain (CARDIA)    Difficulty of Paying Living Expenses: Not hard at all  Food Insecurity: No Food Insecurity (06/09/2018)   Hunger Vital Sign    Worried About Running Out of Food in the Last Year: Never true    Ran Out of Food in the Last Year: Never true  Transportation Needs: No Transportation Needs (06/09/2018)   PRAPARE - Administrator, Civil Service (Medical): No    Lack of Transportation (Non-Medical): No  Physical Activity: Inactive (06/22/2018)   Exercise Vital Sign    Days of Exercise per Week: 0 days    Minutes of Exercise per Session: 0 min  Stress: Stress Concern Present (06/09/2018)   [employer] of Occupational Health - Occupational Stress Questionnaire    Feeling of Stress : Very much  Social Connections: Unknown (06/09/2018)   Social Connection and Isolation Panel [NHANES]    Frequency of Communication with Friends and Family: Not on file    Frequency of Social Gatherings with Friends and Family: Not on file    Attends Religious Services: Never    Active Member of Clubs or Organizations: No    Attends Banker Meetings: Never    Marital Status: Married    Allergies:  Allergies  Allergen Reactions   Tamsulosin Hypertension, Photosensitivity, Rash and Shortness Of Breath   Tramadol Anxiety, Palpitations and Shortness Of Breath   Carbamazepine Diarrhea and Other (See Comments)   Cephalexin Hives   Duloxetine Hcl Other (See Comments)   Lamotrigine Photosensitivity   Levorphanol    Remeron [Mirtazapine]     Chest pain   Seroquel [Quetiapine Fumarate]     "out of it" it opposite of what it was suppose to do   Buprenorphine Hcl Anxiety and Palpitations   Sulfamethoxazole-Trimethoprim Hives and Palpitations    Metabolic Disorder Labs: Lab Results  Component Value Date   HGBA1C 5.2  06/16/2019   No results found for: "PROLACTIN" Lab Results  Component Value Date   CHOL 210 (H) 10/04/2020   TRIG 124.0 10/04/2020   HDL 87.50 10/04/2020   CHOLHDL 2 10/04/2020   VLDL 24.8 10/04/2020   LDLCALC 98 10/04/2020   LDLCALC 136 (H) 06/16/2019   Lab Results  Component Value Date   TSH 0.73 06/16/2019    Therapeutic Level Labs: No results found for: "LITHIUM" No results found for: "VALPROATE" No results found for: "CBMZ"  Current Medications: Current Outpatient  Medications  Medication Sig Dispense Refill   Lemborexant 5 MG TABS Take 5 mg by mouth at bedtime as needed (insomnia). 30 tablet 1   acebutolol (SECTRAL) 200 MG capsule TAKE 1 CAPSULE(200 MG) BY MOUTH DAILY 90 capsule 1   amitriptyline (ELAVIL) 25 MG tablet TAKE 1 TABLET(25 MG) BY MOUTH AT BEDTIME 90 tablet 1   aspirin 325 MG tablet Take 325 mg by mouth daily.     CALCIUM PO Take by mouth daily.     clonazePAM (KLONOPIN) 0.5 MG tablet Take 1 tablet (0.5 mg total) by mouth at bedtime as needed (insomnia). 30 tablet 0   HYDROmorphone (DILAUDID) 2 MG tablet Take 1 tablet (2 mg total) by mouth every 6 (six) hours as needed for severe pain. 20 tablet 0   nitrofurantoin (MACRODANTIN) 100 MG capsule Take 1 capsule PO daily. 90 capsule 1   norethindrone (NORLYDA) 0.35 MG tablet Take 1 tablet (0.35 mg total) by mouth daily. 84 tablet 1   predniSONE (DELTASONE) 20 MG tablet 60 mg x 3 days, 40 mg x 3 days, 20 mg x 3 days, 10 mg x 2 days 19 tablet 2   temazepam (RESTORIL) 30 MG capsule Take 1 capsule (30 mg total) by mouth at bedtime as needed for sleep. 90 capsule 1   VITAMIN D PO Take 2,000 Units by mouth.     No current facility-administered medications for this visit.     Musculoskeletal: Strength & Muscle Tone:  N/A Gait & Station:  N/A Patient leans: N/A  Psychiatric Specialty Exam: Review of Systems  Psychiatric/Behavioral:  Positive for sleep disturbance. Negative for agitation, behavioral problems,  confusion, decreased concentration, dysphoric mood, hallucinations, self-injury and suicidal ideas. The patient is nervous/anxious. The patient is not hyperactive.   All other systems reviewed and are negative.   There were no vitals taken for this visit.There is no height or weight on file to calculate BMI.  General Appearance: Fairly Groomed  Eye Contact:  Good  Speech:  Clear and Coherent  Volume:  Normal  Mood:   not good  Affect:  Appropriate, Congruent, and slightly down  Thought Process:  Coherent  Orientation:  Full (Time, Place, and Person)  Thought Content: Logical   Suicidal Thoughts:  No  Homicidal Thoughts:  No  Memory:  Immediate;   Good  Judgement:  Good  Insight:  Good  Psychomotor Activity:  Normal  Concentration:  Concentration: Good and Attention Span: Good  Recall:  Good  Fund of Knowledge: Good  Language: Good  Akathisia:  No  Handed:  Right  AIMS (if indicated): not done  Assets:  Communication Skills Desire for Improvement  ADL's:  Intact  Cognition: WNL  Sleep:  Poor   Screenings: Oceanographer Row Office Visit from 06/26/2020 in [facility] at [facility] Total Score 0        Assessment and Plan:  LILLA CALLEJO is a 46 y.o. year old female with a history of  insomnia, chronic pain syndrome, multiple back surgeries with complications, urethral spasm, congenital anomaly of the ureter, inappropriate tachycardia, who presents for follow up appointment for below.   1. Insomnia, unspecified type Unstable.  She continues to struggle with insomnia in relation to worsening in back pain. Psychosocial stressors includes loss of her father with COPD a few months ago.  She enjoys interaction with her daughter at home.  We still have not been able to obtain records from the PCP to safely restart  clonazepam as needed for insomnia  (due to some inconsistency in her report at the initial visit, although she has no known history of misuse of  the medication).  Will try lemborexant as needed for insomnia.  Discussed potential risk of significant drowsiness/respiratory suppression with concomitant use of opioid and temazepam.    Plan Continue temazepam 30 mg at night as needed for insomnia Start Lemborexant 5 mg at night as needed for insomnia Obtain record from per PCP. Request was sent.  Next appointment: 10/4 at 9 AM, video - hold clonazepam. Review from her PCP is required for appropriate prescription   - on amitriptyline 25 mg at night (limited benefit from higher dose; prescribed by urology) - on hydromorphone 2 mg up to tid prn    Past trials of medication: citalopram (jaw stiffness), sertraline (restless), fluoxetine (restless), duloxetine, mirtazapine (chest pain), Quetiapine- "sick," Baclofen (crazy dreams), hydroxyzine (hyperactive), gabapentin (drowsiness), melatonin, Ambien (middle insomnia), doxepin (palpitation/insomnia),   trazodone, temazepam, clonazepam, diazepam   I have utilized the [place] Controlled Substances Reporting System (PMP [place]) to confirm adherence regarding the patient's medication. My review reveals appropriate prescription fills.       Collaboration of Care: Collaboration of Care: Other N/A  Patient/Guardian was advised Release of Information must be obtained prior to any record release in order to collaborate their care with an outside provider. Patient/Guardian was advised if they have not already done so to contact the registration department to sign all necessary forms in order for [place] to release information regarding their care.   Consent: Patient/Guardian gives verbal consent for treatment and assignment of benefits for services provided during this visit. Patient/Guardian expressed understanding and agreed to proceed.    [name], MD 04/03/2022, 10:21 AM

## 2022-04-03 ENCOUNTER — Telehealth: Payer: Self-pay

## 2022-04-03 ENCOUNTER — Encounter: Payer: Self-pay | Admitting: Psychiatry

## 2022-04-03 ENCOUNTER — Telehealth (INDEPENDENT_AMBULATORY_CARE_PROVIDER_SITE_OTHER): Payer: Medicare Other | Admitting: Psychiatry

## 2022-04-03 DIAGNOSIS — G4700 Insomnia, unspecified: Secondary | ICD-10-CM

## 2022-04-03 MED ORDER — LEMBOREXANT 5 MG PO TABS: 5.0000 mg | ORAL_TABLET | Freq: Every evening | ORAL | 1 refills | Status: DC | PRN

## 2022-04-03 NOTE — Telephone Encounter (Signed)
prior Berkley Harvey was approved 04-03-22 to 04-04-23

## 2022-04-03 NOTE — Telephone Encounter (Signed)
received fax requesting a prior auth for the dayvigo 5mg 

## 2022-04-03 NOTE — Telephone Encounter (Signed)
went online to [email or web address] and submitted the prior auth . - pending 

## 2022-04-06 ENCOUNTER — Telehealth: Payer: Self-pay

## 2022-04-06 ENCOUNTER — Other Ambulatory Visit: Payer: Self-pay | Admitting: Psychiatry

## 2022-04-06 DIAGNOSIS — G4700 Insomnia, unspecified: Secondary | ICD-10-CM

## 2022-04-06 MED ORDER — CLONAZEPAM 0.5 MG PO TABS: 0.2500 mg | ORAL_TABLET | Freq: Every evening | ORAL | 1 refills | Status: DC | PRN

## 2022-04-06 NOTE — Telephone Encounter (Signed)
pt called states that the new medication that you put her on she can not take she is not going to take it again.  she wants you to call her.

## 2022-04-06 NOTE — Telephone Encounter (Signed)
Discussed with the patient.  She states that she is struggling with insomnia, and had palpitation from lemborexant.  She tried this medication once, and does not want to try it or other medication due to her history of having adverse reaction.  Although she tried to discontinue clonazepam as she does not want to be on medication, she has worsening in insomnia since then.  She agrees with the following. - start clonazepam 0.25-0.5 mg daily as needed for insomnia.  Discussed potential risk of drowsiness, respiratory suppression especially with concomitant use of opioid, temazepam, amitriptyline.    This writer was able to review the note from her PCP in the system. There was no documentation of using clonazepam or any concern of misuse of the medication.   I have utilized the [place] Controlled Substances Reporting System (PMP AWAR[place]) to confirm adherence regarding the patient's medication. My review reveals appropriate prescription fills.

## 2022-04-07 ENCOUNTER — Telehealth: Payer: Self-pay

## 2022-04-07 NOTE — Telephone Encounter (Signed)
I am aware that she tried doxepin, lemborexant. I ordered clonazepam as she had adverse reaction from these  medication.  I am aware that she is on opioid, and temazepam.  She has not had any signs to be concerned of misuse of this medication.  Please discuss this with the pharmacy, and let me know if they still have concerns about this.  Please also make sure to discontinue doxepin, lemborexant if they still have any refills. Thanks.

## 2022-04-07 NOTE — Telephone Encounter (Signed)
called and spoke with Amber at the pharmacy.  gave her your message.

## 2022-04-07 NOTE — Telephone Encounter (Signed)
pharamcy wants to speak with you about patient medication they state that patient had 3 sleep aids on file and they needs to speak with you to get this matter corrected.

## 2022-05-07 ENCOUNTER — Telehealth: Payer: Self-pay

## 2022-05-07 DIAGNOSIS — G4700 Insomnia, unspecified: Secondary | ICD-10-CM

## 2022-05-07 MED ORDER — CLONAZEPAM 0.5 MG PO TABS: 0.2500 mg | ORAL_TABLET | Freq: Every evening | ORAL | 0 refills | Status: DC | PRN

## 2022-05-07 NOTE — Telephone Encounter (Signed)
Patient called to report that her current pharmacy is out of stock of the clonazepam she is requesting that the medication be sent to a different pharmacy [facility] on [address]   clonazePAM (KLONOPIN) 0.5 MG tablet 30 tablet 1 04/06/2022 06/05/2022   Sig - Route: Take 0.5-1 tablets (0.25-0.5 mg total) by mouth at bedtime as needed (insomnia). - Oral   Sent to pharmacy as: clonazePAM (KLONOPIN) 0.5 MG tablet   E-Prescribing Status: Receipt confirmed by pharmacy (04/06/2022 12:58 PM EDT)

## 2022-05-07 NOTE — Telephone Encounter (Signed)
I have sent a limited supply of Klonopin to [facility] on [address].  Please let patient know once her provider is back in office she will be able to send her more supplies.  Will route this message to Dr. Modesta Messing to address once she is back in office.

## 2022-05-08 NOTE — Telephone Encounter (Signed)
She will have enough clonazepam to last until her next visit. Will plan to order refill after the visit .

## 2022-05-18 NOTE — Progress Notes (Unsigned)
Virtual Visit via Video Note  I connected with Christie Hurst on 05/20/22 at  9:00 AM EDT by a video enabled telemedicine application and verified that I am speaking with the correct person using two identifiers.  Location: Patient: home Provider: office Persons participated in the visit- patient, provider    I discussed the limitations of evaluation and management by telemedicine and the availability of in person appointments. The patient expressed understanding and agreed to proceed.     I discussed the assessment and treatment plan with the patient. The patient was provided an opportunity to ask questions and all were answered. The patient agreed with the plan and demonstrated an understanding of the instructions.   The patient was advised to call back or seek an in-person evaluation if the symptoms worsen or if the condition fails to improve as anticipated.  I provided 15 minutes of non-face-to-face time during this encounter.   Christie Clay, MD    [facility] MD/PA/NP OP Progress Note  05/20/2022 9:32 AM [name]  MRN:  [id number]  Chief Complaint:  Chief Complaint  Patient presents with   Follow-up   HPI:  This is a follow-up appointment for insomnia and an adjustment disorder.  She states that she has being 85% better since started on clonazepam.  Although she has middle insomnia, she has been able to sleep several hours.  She feels much better, and not feeling exhausted anymore.  She went to grocery shopping, and had a flare up of back pain.  She was given steroid shot, and she has been able to handle without opioid.  She feels there is no snow ball now that she is able to sleep better.  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  Although she feels down at times, it has been minimal.  She feels occasional anxiety due to the concern of financial strain as she does not work.  She  denies SI.  She denies alcohol use, drug use or cigarette use.  She denies any drowsiness or fall.  She feels comfortable to stay on the current medication regimen.   Visit Diagnosis:    ICD-10-CM   1. Adjustment disorder with mixed anxiety and depressed mood  F43.23     2. Insomnia, unspecified type  G47.00 clonazePAM (KLONOPIN) 0.5 MG tablet      Past Psychiatric History: Please see initial evaluation for full details. I have reviewed the history. No updates at this time.     Past Medical History:  Past Medical History:  Diagnosis Date   Chronic back pain    Frequent UTI    d/t congential abnormality    History of DVT (deep vein thrombosis) 05/2013   Inappropriate sinus tachycardia    Phlebitis    POTS (postural orthostatic tachycardia syndrome)    Tachycardia    Urethral spasm    UTI (urinary tract infection)     Past Surgical History:  Procedure Laterality Date   CESAREAN SECTION     x 2    LAMINECTOMY  12/2012   L5/S1   microdiscetomy  11/2012   L5/S1   SPINAL FUSION  03/2013   L5/S1   stent/DVT  05/2013   Left groin    Family Psychiatric History: Please see initial evaluation for full details. I have reviewed the history. No updates at this time.     Family History:  Family History  Problem Relation Age of Onset   Early death Mother 34   Transient ischemic attack Mother    Depression Mother    Mental illness Mother    Transient ischemic attack Father    Breast cancer Paternal Grandmother    Breast cancer Paternal Aunt    Breast cancer Paternal Aunt    Ehlers-Danlos syndrome Daughter     Social History:  Social History   Socioeconomic History   Marital status: Married    Spouse name: Christie Hurst   Number of children: 2   Years of education: Not on file   Highest education level: Bachelor's degree (e.g., BA, AB, BS)  Occupational History   Not on file  Tobacco Use   Smoking status: Never   Smokeless tobacco: Never  Vaping Use   Vaping Use: Never  used  Substance and Sexual Activity   Alcohol use: Never   Drug use: Never   Sexual activity: Yes  Other Topics Concern   Not on file  Social History Narrative   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    Two children ( 15 & 71)    Social Determinants of Health   Financial Resource Strain: Low Risk  (06/09/2018)   Overall Financial Resource Strain (CARDIA)    Difficulty of Paying Living Expenses: Not hard at all  Food Insecurity: No Food Insecurity (06/09/2018)   Hunger Vital Sign    Worried About Running Out of Food in the Last Year: Never true    Ran Out of Food in the Last Year: Never true  Transportation Needs: No Transportation Needs (06/09/2018)   PRAPARE - Administrator, Civil Service (Medical): No    Lack of Transportation (Non-Medical): No  Physical Activity: Inactive (06/22/2018)   Exercise Vital Sign    Days of Exercise per Week: 0 days    Minutes of Exercise per Session: 0 min  Stress: Stress Concern Present (06/09/2018)   [employer] of Occupational Health - Occupational Stress Questionnaire    Feeling of Stress : Very much  Social Connections: Unknown (06/09/2018)   Social Connection and Isolation Panel [NHANES]    Frequency of Communication with Friends and Family: Not on file    Frequency of Social Gatherings with Friends and Family: Not on file    Attends Religious Services: Never    Active Member of Clubs or Organizations: No    Attends Banker Meetings: Never    Marital Status: Married    Allergies:  Allergies  Allergen Reactions   Tamsulosin Hypertension, Photosensitivity, Rash and Shortness Of Breath   Tramadol Anxiety, Palpitations and Shortness Of Breath   Carbamazepine Diarrhea and Other (See Comments)   Cephalexin Hives   Duloxetine Hcl Other (See Comments)   Lamotrigine Photosensitivity   Levorphanol    Remeron [Mirtazapine]     Chest pain   Seroquel [Quetiapine Fumarate]     "out of it" it opposite of  what it was suppose to do   Buprenorphine Hcl Anxiety and Palpitations   Sulfamethoxazole-Trimethoprim Hives and Palpitations    Metabolic Disorder Labs: Lab Results  Component Value Date   HGBA1C 5.2 06/16/2019   No results found for: "PROLACTIN" Lab Results  Component Value Date   CHOL 210 (H) 10/04/2020   TRIG 124.0 10/04/2020   HDL 87.50 10/04/2020   CHOLHDL 2 10/04/2020   VLDL 24.8 10/04/2020   LDLCALC 98 10/04/2020   LDLCALC 136 (H) 06/16/2019   Lab  Results  Component Value Date   TSH 0.73 06/16/2019    Therapeutic Level Labs: No results found for: "LITHIUM" No results found for: "VALPROATE" No results found for: "CBMZ"  Current Medications: Current Outpatient Medications  Medication Sig Dispense Refill   acebutolol (SECTRAL) 200 MG capsule TAKE 1 CAPSULE(200 MG) BY MOUTH DAILY 90 capsule 1   amitriptyline (ELAVIL) 25 MG tablet TAKE 1 TABLET(25 MG) BY MOUTH AT BEDTIME 90 tablet 1   CALCIUM PO Take by mouth daily.     [START ON 05/23/2022] clonazePAM (KLONOPIN) 0.5 MG tablet Take 0.5-1 tablets (0.25-0.5 mg total) by mouth at bedtime as needed (insomnia). 30 tablet 1   HYDROmorphone (DILAUDID) 2 MG tablet Take 1 tablet (2 mg total) by mouth every 6 (six) hours as needed for severe pain. 20 tablet 0   nitrofurantoin (MACRODANTIN) 100 MG capsule Take 1 capsule PO daily. 90 capsule 1   norethindrone (NORLYDA) 0.35 MG tablet Take 1 tablet (0.35 mg total) by mouth daily. 84 tablet 1   predniSONE (DELTASONE) 20 MG tablet 60 mg x 3 days, 40 mg x 3 days, 20 mg x 3 days, 10 mg x 2 days 19 tablet 2   temazepam (RESTORIL) 30 MG capsule Take 1 capsule (30 mg total) by mouth at bedtime as needed for sleep. 90 capsule 1   VITAMIN D PO Take 2,000 Units by mouth.     No current facility-administered medications for this visit.     Musculoskeletal: Strength & Muscle Tone:  N/A Gait & Station:  N/A Patient leans: N/A  Psychiatric Specialty Exam: Review of Systems   Psychiatric/Behavioral:  Positive for decreased concentration and sleep disturbance. Negative for agitation, behavioral problems, confusion, dysphoric mood, hallucinations, self-injury and suicidal ideas. The patient is nervous/anxious. The patient is not hyperactive.   All other systems reviewed and are negative.   There were no vitals taken for this visit.There is no height or weight on file to calculate BMI.  General Appearance: Fairly Groomed  Eye Contact:  Good  Speech:  Clear and Coherent  Volume:  Normal  Mood:   better  Affect:  Appropriate, Congruent, and calmer  Thought Process:  Coherent  Orientation:  Full (Time, Place, and Person)  Thought Content: Logical   Suicidal Thoughts:  No  Homicidal Thoughts:  No  Memory:  Immediate;   Good  Judgement:  Good  Insight:  Good  Psychomotor Activity:  Normal  Concentration:  Concentration: Good and Attention Span: Good  Recall:  Good  Fund of Knowledge: Good  Language: Good  Akathisia:  No  Handed:  Right  AIMS (if indicated): not done  Assets:  Communication Skills Desire for Improvement  ADL's:  Intact  Cognition: WNL  Sleep:  Fair   Screenings: Ocean[facility] Office Visit from 06/26/2020 in [facility] Total Score 0        Assessment and Plan:  JOYELLE SIEDLECKI is a 46 y.o. year old female with a history of insomnia, chronic pain syndrome, multiple back surgeries with complications, urethral spasm, congenital anomaly of the ureter, inappropriate tachycardia, who presents for follow up appointment for below.   1. Insomnia, unspecified type 2. Adjustment disorder with mixed anxiety and depressed mood There has been significant improvement in insomnia and down mood and then anxiety since starting clonazepam.  Psychosocial stressors includes chronic back pain with occasional flare up, loss of her father with COPD. She enjoys interaction with her daughter at  home.  Will continue  current regimen of temazepam for insomnia, and clonazepam for insomnia/anxiety.  Discussed potential risk of significant drowsiness/respiratory suppression with concomitant use of opioid and temazepam.   Noted that although she did try at least several psychotropics/hypnotics, it caused adverse reactions.  Benefits of treating her condition outweighs its potential risk.    Plan Continue temazepam 30 mg at night as needed for insomnia Continue clonazepam 0.5 mg at night as needed for insomnia/anxiety Next appointment 12/6 at 2 30, video - on amitriptyline 25 mg at night (limited benefit from higher dose; prescribed by urology) - on hydromorphone 2 mg up to tid prn    Past trials of medication: citalopram (jaw stiffness), sertraline (restless), fluoxetine (restless), duloxetine, mirtazapine (chest pain), Quetiapine- "sick," Baclofen (crazy dreams), hydroxyzine (hyperactive), gabapentin (drowsiness), melatonin, Ambien (middle insomnia), doxepin (palpitation/insomnia),   trazodone, temazepam, clonazepam, diazepam, lemborexant (palpitation)   Collaboration of Care: Collaboration of Care: Other N/A  Patient/Guardian was advised Release of Information must be obtained prior to any record release in order to collaborate their care with an outside provider. Patient/Guardian was advised if they have not already done so to contact the registration department to sign all necessary forms in order for [place] to release information regarding their care.   Consent: Patient/Guardian gives verbal consent for treatment and assignment of benefits for services provided during this visit. Patient/Guardian expressed understanding and agreed to proceed.    [name], MD 05/20/2022, 9:32 AM

## 2022-05-20 ENCOUNTER — Encounter: Payer: Self-pay | Admitting: Psychiatry

## 2022-05-20 ENCOUNTER — Telehealth (INDEPENDENT_AMBULATORY_CARE_PROVIDER_SITE_OTHER): Payer: Medicare Other | Admitting: Psychiatry

## 2022-05-20 DIAGNOSIS — G4700 Insomnia, unspecified: Secondary | ICD-10-CM

## 2022-05-20 DIAGNOSIS — F4323 Adjustment disorder with mixed anxiety and depressed mood: Secondary | ICD-10-CM | POA: Diagnosis not present

## 2022-05-20 MED ORDER — CLONAZEPAM 0.5 MG PO TABS: 0.2500 mg | ORAL_TABLET | Freq: Every evening | ORAL | 1 refills | Status: DC | PRN

## 2022-05-22 ENCOUNTER — Telehealth: Payer: Self-pay

## 2022-05-22 ENCOUNTER — Other Ambulatory Visit: Payer: Self-pay | Admitting: Psychiatry

## 2022-05-22 DIAGNOSIS — G4700 Insomnia, unspecified: Secondary | ICD-10-CM

## 2022-05-22 MED ORDER — CLONAZEPAM 0.5 MG PO TABS: 0.2500 mg | ORAL_TABLET | Freq: Every evening | ORAL | 1 refills | Status: DC | PRN

## 2022-05-22 NOTE — Telephone Encounter (Signed)
pt called states that the [facility] is ot of the [facility] and it may take 3 -4 weeks to get in. [facility] [address] [facility] [place] [facility]  can you please send a rx to there?  Last seen on 05-20-22 next appt 07-22-22       Disp Refills Start End   clonazePAM (KLONOPIN) 0.5 MG tablet 30 tablet 1 05/23/2022 07/22/2022   Sig - Route: Take 0.5-1 tablets (0.25-0.5 mg total) by mouth at bedtime as needed (insomnia). - Oral   Sent to pharmacy as: clonazePAM (KLONOPIN) 0.5 MG tablet   E-Prescribing Status: Receipt confirmed by pharmacy (05/20/2022  9:25 AM EDT)    Associated Diagnoses  Insomnia, unspecified type      [place], [place]

## 2022-05-22 NOTE — Telephone Encounter (Signed)
Pharmacy notified.

## 2022-05-22 NOTE — Telephone Encounter (Signed)
Ordered. Could you contact [facility] on [address] to cancel the original order? Thanks.

## 2022-05-22 NOTE — Telephone Encounter (Signed)
Pt.notified

## 2022-07-06 ENCOUNTER — Telehealth: Payer: Self-pay

## 2022-07-06 ENCOUNTER — Telehealth (INDEPENDENT_AMBULATORY_CARE_PROVIDER_SITE_OTHER): Payer: Medicare Other | Admitting: Psychiatry

## 2022-07-06 ENCOUNTER — Encounter: Payer: Self-pay | Admitting: Psychiatry

## 2022-07-06 DIAGNOSIS — G4700 Insomnia, unspecified: Secondary | ICD-10-CM | POA: Diagnosis not present

## 2022-07-06 DIAGNOSIS — F4323 Adjustment disorder with mixed anxiety and depressed mood: Secondary | ICD-10-CM | POA: Diagnosis not present

## 2022-07-06 MED ORDER — CLONAZEPAM 0.5 MG PO TABS: 0.2500 mg | ORAL_TABLET | Freq: Every evening | ORAL | 1 refills | Status: DC | PRN

## 2022-07-06 MED ORDER — TEMAZEPAM 30 MG PO CAPS: 30.0000 mg | ORAL_CAPSULE | Freq: Every evening | ORAL | 0 refills | Status: DC | PRN

## 2022-07-06 NOTE — Telephone Encounter (Signed)
I just spoke with both the patient and the pharmacy. It should be good.

## 2022-07-06 NOTE — Telephone Encounter (Signed)
pt called she states that the primary wants you to take over the temazepam. also pharmacy needs your ok to fill this medication and the clonazepam.

## 2022-07-06 NOTE — Progress Notes (Signed)
Virtual Visit via Video Note  I connected with Christie Hurst on 07/06/22 at 11:30 AM EST by a video enabled telemedicine application and verified that I am speaking with the correct person using two identifiers.  Location: Patient: home Provider: office Persons participated in the visit- patient, provider    I discussed the limitations of evaluation and management by telemedicine and the availability of in person appointments. The patient expressed understanding and agreed to proceed.   I discussed the assessment and treatment plan with the patient. The patient was provided an opportunity to ask questions and all were answered. The patient agreed with the plan and demonstrated an understanding of the instructions.   The patient was advised to call back or seek an in-person evaluation if the symptoms worsen or if the condition fails to improve as anticipated.  I provided 15 minutes of non-face-to-face time during this encounter.   [name], MD        [facility] MD/PA/NP OP Progress Note  07/06/2022 12:10 PM Christie Hurst  MRN:  [id number]  Chief Complaint:  Chief Complaint  Patient presents with   Follow-up   HPI:  This is a follow-up appointment for insomnia and an adjustment disorder.  She states that she was notified by the pharmacy that they need approval of feeling temazepam by both PCP and this Clinical research associate.  Her PCP also told her that she would like this writer to take over the prescription of temazepam.  She could not get temazepam last weekend, and she slept only 20 minutes.  She was sleeping well as long as she is on medication, although it can be only a few hours.  She continues to struggle with pain.  She tries not to do certain things as it can cause worsening in pain.  She states that she feels like she was stopped with a hot knife.  It has been discouraging.  Although she wants to do things as she used to, she cannot do them due to pain.  She feels like she is wasting  the time.  She feels that it has been heard on her husband as well as she cannot count on her.  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  She tends to feel anxious and depressed when she has pain, and knowing that it will not be getting better. She continues to have good relationship with her daughter at home. She denies SI.  She finds recent addition of Celebrex to be helpful/ She denies alcohol use, drug use.    Component 11 d ago Comments  Drugs Identified Summary, Urine Note ==================================================================== [employer] Select 13 (MW) ==================================================================== Test                             Result       Flag       Units  Drug Present   Oxazepam                       >866                    ng/mg creat   Temazepam                      >866                    ng/mg creat    Oxazepam and temazepam  are expected metabolites of diazepam.    Oxazepam is also an expected metabolite of other benzodiazepine    drugs, including chlordiazepoxide, prazepam, clorazepate, halazepam,    and temazepam.  Oxazepam and temazepam are available as scheduled    prescription medications.    7-aminoclonazepam              165                     ng/mg creat    7-aminoclonazepam is an expected metabolite of clonazepam. Source of    clonazepam is a scheduled prescription medication.    Hydromorphone                  1761                    ng/mg creat    Hydromorphone may be administered as a scheduled prescription    medication; it is also an expected metabolite of hydrocodone.  ==================================================================== Test                      Result    Flag   Units      Ref Range   Creatinine              231              mg/dL      >=56    Visit Diagnosis:    ICD-10-CM   1. Insomnia, unspecified type  G47.00     2. Adjustment disorder with mixed anxiety and depressed mood  F43.23       Past  Psychiatric History: Please see initial evaluation for full details. I have reviewed the history. No updates at this time.     Past Medical History:  Past Medical History:  Diagnosis Date   Chronic back pain    Frequent UTI    d/t congential abnormality    History of DVT (deep vein thrombosis) 05/2013   Inappropriate sinus tachycardia    Phlebitis    POTS (postural orthostatic tachycardia syndrome)    Tachycardia    Urethral spasm    UTI (urinary tract infection)     Past Surgical History:  Procedure Laterality Date   CESAREAN SECTION     x 2    LAMINECTOMY  12/2012   L5/S1   microdiscetomy  11/2012   L5/S1   SPINAL FUSION  03/2013   L5/S1   stent/DVT  05/2013   Left groin    Family Psychiatric History: Please see initial evaluation for full details. I have reviewed the history. No updates at this time.     Family History:  Family History  Problem Relation Age of Onset   Early death Mother 83   Transient ischemic attack Mother    Depression Mother    Mental illness Mother    Transient ischemic attack Father    Breast cancer Paternal Grandmother    Breast cancer Paternal Aunt    Breast cancer Paternal Aunt    Ehlers-Danlos syndrome Daughter     Social History:  Social History   Socioeconomic History   Marital status: Married    Spouse name: jamie   Number of children: 2   Years of education: Not on file   Highest education level: Bachelor's degree (e.g., BA, AB, BS)  Occupational History   Not on file  Tobacco Use   Smoking status: Never   Smokeless tobacco: Never  Vaping Use   Vaping Use: Never used  Substance and Sexual Activity   Alcohol use: Never   Drug use: Never   Sexual activity: Yes  Other Topics Concern   Not on file  Social History Narrative   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    Two children ( 15 & 3817)    Social Determinants of Health   Financial Resource Strain: Low Risk  (06/09/2018)   Overall Financial Resource Strain  (CARDIA)    Difficulty of Paying Living Expenses: Not hard at all  Food Insecurity: No Food Insecurity (06/09/2018)   Hunger Vital Sign    Worried About Running Out of Food in the Last Year: Never true    Ran Out of Food in the Last Year: Never true  Transportation Needs: No Transportation Needs (06/09/2018)   PRAPARE - Administrator, Civil ServiceTransportation    Lack of Transportation (Medical): No    Lack of Transportation (Non-Medical): No  Physical Activity: Inactive (06/22/2018)   Exercise Vital Sign    Days of Exercise per Week: 0 days    Minutes of Exercise per Session: 0 min  Stress: Stress Concern Present (06/09/2018)   [employer] - Occupational Stress Questionnaire    Feeling of Stress : Very much  Social Connections: Unknown (06/09/2018)   Social Connection and Isolation Panel [NHANES]    Frequency of Communication with Friends and Family: Not on file    Frequency of Social Gatherings with Friends and Family: Not on file    Attends Religious Services: Never    Active Member of Clubs or Organizations: No    Attends BankerClub or Organization Meetings: Never    Marital Status: Married    Allergies:  Allergies  Allergen Reactions   Tamsulosin Hypertension, Photosensitivity, Rash and Shortness Of Breath   Tramadol Anxiety, Palpitations and Shortness Of Breath   Carbamazepine Diarrhea and Other (See Comments)   Cephalexin Hives   Duloxetine Hcl Other (See Comments)   Lamotrigine Photosensitivity   Levorphanol    Remeron [Mirtazapine]     Chest pain   Seroquel [Quetiapine Fumarate]     "out of it" it opposite of what it was suppose to do   Buprenorphine Hcl Anxiety and Palpitations   Sulfamethoxazole-Trimethoprim Hives and Palpitations    Metabolic Disorder Labs: Lab Results  Component Value Date   HGBA1C 5.2 06/16/2019   No results found for: "PROLACTIN" Lab Results  Component Value Date   CHOL 210 (H) 10/04/2020   TRIG 124.0 10/04/2020   HDL 87.50 10/04/2020    CHOLHDL 2 10/04/2020   VLDL 24.8 10/04/2020   LDLCALC 98 10/04/2020   LDLCALC 136 (H) 06/16/2019   Lab Results  Component Value Date   TSH 0.73 06/16/2019    Therapeutic Level Labs: No results found for: "LITHIUM" No results found for: "VALPROATE" No results found for: "CBMZ"  Current Medications: Current Outpatient Medications  Medication Sig Dispense Refill   celecoxib (CELEBREX) 200 MG capsule Take 200 mg by mouth 2 (two) times daily.     acebutolol (SECTRAL) 200 MG capsule TAKE 1 CAPSULE(200 MG) BY MOUTH DAILY 90 capsule 1   amitriptyline (ELAVIL) 25 MG tablet TAKE 1 TABLET(25 MG) BY MOUTH AT BEDTIME 90 tablet 1   CALCIUM PO Take by mouth daily.     clonazePAM (KLONOPIN) 0.5 MG tablet Take 0.5-1 tablets (0.25-0.5 mg total) by mouth at bedtime as needed (insomnia). 30 tablet 1   HYDROmorphone (DILAUDID) 2 MG tablet Take  1 tablet (2 mg total) by mouth every 6 (six) hours as needed for severe pain. 20 tablet 0   nitrofurantoin (MACRODANTIN) 100 MG capsule Take 1 capsule PO daily. 90 capsule 1   norethindrone (NORLYDA) 0.35 MG tablet Take 1 tablet (0.35 mg total) by mouth daily. 84 tablet 1   predniSONE (DELTASONE) 20 MG tablet 60 mg x 3 days, 40 mg x 3 days, 20 mg x 3 days, 10 mg x 2 days 19 tablet 2   temazepam (RESTORIL) 30 MG capsule Take 1 capsule (30 mg total) by mouth at bedtime as needed for sleep. 90 capsule 1   VITAMIN D PO Take 2,000 Units by mouth.     No current facility-administered medications for this visit.     Musculoskeletal: Strength & Muscle Tone:  N/A Gait & Station:  N/A Patient leans: N/A  Psychiatric Specialty Exam: Review of Systems  Psychiatric/Behavioral:  Positive for dysphoric mood and sleep disturbance. Negative for agitation, behavioral problems, confusion, decreased concentration, hallucinations, self-injury and suicidal ideas. The patient is nervous/anxious. The patient is not hyperactive.   All other systems reviewed and are negative.    There were no vitals taken for this visit.There is no height or weight on file to calculate BMI.  General Appearance: Fairly Groomed  Eye Contact:  Good  Speech:  Clear and Coherent  Volume:  Normal  Mood:   not good  Affect:  Appropriate, Congruent, and slightly tense, but calm  Thought Process:  Coherent  Orientation:  Full (Time, Place, and Person)  Thought Content: Logical   Suicidal Thoughts:  No  Homicidal Thoughts:  No  Memory:  Immediate;   Good  Judgement:  Good  Insight:  Good  Psychomotor Activity:  Normal  Concentration:  Concentration: Good and Attention Span: Good  Recall:  Good  Fund of Knowledge: Good  Language: Good  Akathisia:  No  Handed:  Right  AIMS (if indicated): not done  Assets:  Communication Skills Desire for Improvement  ADL's:  Intact  Cognition: WNL  Sleep:  Poor   Screenings: Oceanographer [facility] Office Visit from 06/26/2020 in [facility] at [facility] Total Score 0        Assessment and Plan:  Christie Hurst is a 46 y.o. year old female with a history of  insomnia, chronic pain syndrome, multiple back surgeries with complications, urethral spasm, congenital anomaly of the ureter, inappropriate tachycardia, who presents for follow up appointment for below.   1. Insomnia, unspecified type 2. Adjustment disorder with mixed anxiety and depressed mood She significant worsening in insomnia in the context of not being able to fill temazepam.  Psychosocial stressors includes chronic back pain with occasional flare up, loss of her father with COPD. She enjoys interaction with her daughter at home.  Will restart temazepam to target insomnia.  Will continue current dose of clonazepam for insomnia/anxiety.  Noted that although it is preferable to consolidate to 1 benzodiazepine, and there is a concern of increased risk of respiratory suppression with concomitant use of opioid, she had significant worsening in insomnia when she is  not on the combination of these/had adverse reaction from other hypnotics in the past.  She is aware of the risk of dependence/tolerance as well, and verbalized understanding that this medication will not be uptitrate.  She is open to try psychotherapy at this time, and she will greatly benefit from ACT; will make a referral.   Plan Restart temazepam 30  mg at night as needed for insomnia (her PCP was prescribing this),  Continue clonazepam 0.5 mg at night as needed for insomnia/anxiety Referral to therapy, Dr. Bosie Clos Next appointment 1/15 at 1 PM, in person - on amitriptyline 25 mg at night (limited benefit from higher dose; prescribed by urology) - on hydromorphone 2 mg up to tid prn  - She had in house utox at her PCP in Nov 2023: the results are consistent with the medication she is on. No other substances.   Past trials of medication: citalopram (jaw stiffness), sertraline (restless), fluoxetine (restless), duloxetine, mirtazapine (chest pain), Quetiapine- "sick," Baclofen (crazy dreams), hydroxyzine (hyperactive), gabapentin (drowsiness), melatonin, Ambien (middle insomnia), doxepin (palpitation/insomnia),   trazodone, temazepam, clonazepam, diazepam, lemborexant (palpitation)   This clinician has discussed the side effect associated with medication prescribed during this encounter. Please refer to notes in the previous encounters for more details.   I have utilized the [place] Controlled Substances Reporting System (PMP AWARxE) to confirm adherence regarding the patient's medication. My review reveals appropriate prescription fills.    Collaboration of Care: Collaboration of Care: Other reviewed notes in Epic  Patient/Guardian was advised Release of Information must be obtained prior to any record release in order to collaborate their care with an outside provider. Patient/Guardian was advised if they have not already done so to contact the registration department to sign all necessary forms in  order for [place] to release information regarding their care.   Consent: Patient/Guardian gives verbal consent for treatment and assignment of benefits for services provided during this visit. Patient/Guardian expressed understanding and agreed to proceed.    [name], MD 07/06/2022, 12:10 PM

## 2022-07-14 ENCOUNTER — Telehealth: Payer: Medicare Other | Admitting: Psychiatry

## 2022-07-22 ENCOUNTER — Telehealth: Payer: Medicare Other | Admitting: Psychiatry

## 2022-07-23 ENCOUNTER — Telehealth: Payer: Self-pay

## 2022-07-23 ENCOUNTER — Other Ambulatory Visit: Payer: Self-pay | Admitting: Psychiatry

## 2022-07-23 MED ORDER — CLONAZEPAM 0.5 MG PO TABS: 0.2500 mg | ORAL_TABLET | Freq: Every evening | ORAL | 1 refills | Status: DC | PRN

## 2022-07-23 NOTE — Telephone Encounter (Signed)
Ordered. Please contact [facility] and cancel clonazepam order.

## 2022-07-23 NOTE — Telephone Encounter (Signed)
pt states that [facility] not [facility] k[facility] in [facility] [address] in [place]   I told [facility] to [facility] this afternoon since I am not going to be in the office later.

## 2022-08-03 ENCOUNTER — Ambulatory Visit (INDEPENDENT_AMBULATORY_CARE_PROVIDER_SITE_OTHER): Payer: Medicare Other | Admitting: Psychologist

## 2022-08-03 ENCOUNTER — Telehealth: Payer: Self-pay

## 2022-08-03 ENCOUNTER — Other Ambulatory Visit: Payer: Self-pay | Admitting: Psychiatry

## 2022-08-03 DIAGNOSIS — F330 Major depressive disorder, recurrent, mild: Secondary | ICD-10-CM | POA: Diagnosis not present

## 2022-08-03 NOTE — Telephone Encounter (Signed)
Medication mangement - Telephone call with pt to inform per Dr. Vanetta Shawl she should have a new order for her temazepam at her pharmacy. Patient to check with them and to call back if any issues filling when due.

## 2022-08-03 NOTE — Telephone Encounter (Signed)
Could you advise her to contact that pharmacy- I did order medication to that pharmacy, and she has not picked it up yet according to the database.

## 2022-08-03 NOTE — Progress Notes (Signed)
                Brilee Port, PsyD 

## 2022-08-03 NOTE — Telephone Encounter (Signed)
Medication refill-Patient left a message she is in need of a new Temazepam order to be sent to her [facility] [address]. Pt last seen 07/06/22 and returns next on 08/31/22. Medication was previously written by her PCP.

## 2022-08-03 NOTE — Progress Notes (Signed)
[facility] Counselor Initial Adult Exam  Name: Christie Hurst Date: 08/03/2022 MRN: [id number] DOB: 1975-08-26 PCP: Medicine, [facility]  Time spent: 09:02 am to 09:40 am; total time: 38 minutes  This session was held via in person. The patient consented to in-person therapy and was in the clinician's office. Limits of confidentiality were discussed with the patient  Guardian/Payee:  NA    Paperwork requested: No   Reason for Visit /Presenting Problem: Depression secondary to chronic pain  Mental Status Exam: Appearance:   Well Groomed     Behavior:  Appropriate  Motor:  Normal  Speech/Language:   Clear and Coherent  Affect:  Appropriate  Mood:  normal  Thought process:  normal  Thought content:    WNL  Sensory/Perceptual disturbances:    WNL  Orientation:  oriented to person, place, and time/date  Attention:  Good  Concentration:  Good  Memory:  WNL  Fund of knowledge:   Good  Insight:    Fair  Judgment:   Good  Impulse Control:  Good     Reported Symptoms:  The patient endorsed experiencing the following: feeling down, sad, tearful, social isolation, avoiding pleasurable activities, and rumination of negative thoughts. She denied suicidal and homicidal ideation.   Risk Assessment: Danger to Self:  No Self-injurious Behavior: No Danger to Others: No Duty to Warn:no Physical Aggression / Violence:No  Access to Firearms a concern: No  Gang Involvement:No  Patient / guardian was educated about steps to take if suicide or homicide risk level increases between visits: n/a While future psychiatric events cannot be accurately predicted, the patient does not currently require acute inpatient psychiatric care and does not currently meet [facility] involuntary commitment criteria.  Substance Abuse History: Current substance abuse: No     Past Psychiatric History:   Previous psychological history is significant for anxiety and  depression Outpatient Providers:NA History of Psych Hospitalization: No  Psychological Testing:  NA    Abuse History:  Victim of: No.,  NA    Report needed: No. Victim of Neglect:No. Perpetrator of  NA   Witness / Exposure to Domestic Violence: No   Protective Services Involvement: No  Witness to [employer] Violence:  No   Family History:  Family History  Problem Relation Age of Onset   Early death Mother 29   Transient ischemic attack Mother    Depression Mother    Mental illness Mother    Transient ischemic attack Father    Breast cancer Paternal Grandmother    Breast cancer Paternal Aunt    Breast cancer Paternal Aunt    Ehlers-Danlos syndrome Daughter     Living situation: the patient lives with their family  Sexual Orientation: Straight  Relationship Status: married  Name of spouse / other:[name]. They have been married for 27 years.  If a parent, number of children / ages:Patient has a 48 year old daughter named [name] and a 48 year old daughter named [name]. Maya lives in [place].   Support Systems: [name], her daughter  Financial Stress:  No   Income/Employment/Disability: Social Security Disability. Used to work as a Engineer, civil (consulting). Plan was to become a NP  Military Service: No   Educational History: Education: college graduate  Religion/Sprituality/World View: Christian  Any cultural differences that may affect / interfere with treatment:  not applicable   Recreation/Hobbies: hiking, working out, and being active  Stressors: Other: living with chronic pain    Strengths: Supportive Relationships  Barriers:  chronic  back pain   Legal History: Pending legal issue / charges: The patient has no significant history of legal issues. History of legal issue / charges:  NA  Medical History/Surgical History: reviewed Past Medical History:  Diagnosis Date   Chronic back pain    Frequent UTI    d/t congential abnormality    History of DVT (deep vein thrombosis)  05/2013   Inappropriate sinus tachycardia    Phlebitis    POTS (postural orthostatic tachycardia syndrome)    Tachycardia    Urethral spasm    UTI (urinary tract infection)     Past Surgical History:  Procedure Laterality Date   CESAREAN SECTION     x 2    LAMINECTOMY  12/2012   L5/S1   microdiscetomy  11/2012   L5/S1   SPINAL FUSION  03/2013   L5/S1   stent/DVT  05/2013   Left groin    Medications: Current Outpatient Medications  Medication Sig Dispense Refill   acebutolol (SECTRAL) 200 MG capsule TAKE 1 CAPSULE(200 MG) BY MOUTH DAILY 90 capsule 1   amitriptyline (ELAVIL) 25 MG tablet TAKE 1 TABLET(25 MG) BY MOUTH AT BEDTIME 90 tablet 1   CALCIUM PO Take by mouth daily.     celecoxib (CELEBREX) 200 MG capsule Take 200 mg by mouth 2 (two) times daily.     clonazePAM (KLONOPIN) 0.5 MG tablet Take 0.5-1 tablets (0.25-0.5 mg total) by mouth at bedtime as needed for anxiety. 30 tablet 1   HYDROmorphone (DILAUDID) 2 MG tablet Take 1 tablet (2 mg total) by mouth every 6 (six) hours as needed for severe pain. 20 tablet 0   nitrofurantoin (MACRODANTIN) 100 MG capsule Take 1 capsule PO daily. 90 capsule 1   norethindrone (NORLYDA) 0.35 MG tablet Take 1 tablet (0.35 mg total) by mouth daily. 84 tablet 1   predniSONE (DELTASONE) 20 MG tablet 60 mg x 3 days, 40 mg x 3 days, 20 mg x 3 days, 10 mg x 2 days 19 tablet 2   temazepam (RESTORIL) 30 MG capsule Take 1 capsule (30 mg total) by mouth at bedtime as needed for sleep. 90 capsule 1   [START ON 08/06/2022] temazepam (RESTORIL) 30 MG capsule Take 1 capsule (30 mg total) by mouth at bedtime as needed for sleep. 30 capsule 0   VITAMIN D PO Take 2,000 Units by mouth.     No current facility-administered medications for this visit.    Allergies  Allergen Reactions   Tamsulosin Hypertension, Photosensitivity, Rash and Shortness Of Breath   Tramadol Anxiety, Palpitations and Shortness Of Breath   Carbamazepine Diarrhea and Other (See  Comments)   Cephalexin Hives   Duloxetine Hcl Other (See Comments)   Lamotrigine Photosensitivity   Levorphanol    Remeron [Mirtazapine]     Chest pain   Seroquel [Quetiapine Fumarate]     "out of it" it opposite of what it was suppose to do   Buprenorphine Hcl Anxiety and Palpitations   Sulfamethoxazole-Trimethoprim Hives and Palpitations    Diagnoses:  F33.0 major depressive affective disorder, recurrent, mild   Plan of Care: The patient is a 46 year old Caucasian woman who was referred due to experiencing depressive symptoms secondary to chronic back pain. The patient lives with her husband, youngest daughter, and two dogs. The patient meets criteria for a diagnosis of F33.0 major depressive affective disorder, recurrent, mild based off of the following: feeling down, sad, tearful, social isolation, avoiding pleasurable activities, and rumination of negative thoughts. She  denied suicidal and homicidal ideation.   The patient stated that she wants to learn to live and function with the pain.  This psychologist makes the recommendation that if possible the patient participate in therapy bi-weekly.    Hilbert Corrigan, PsyD

## 2022-08-12 NOTE — Telephone Encounter (Signed)
Pharmacy notified left message on pharmacy line

## 2022-08-29 NOTE — Progress Notes (Unsigned)
BH MD/PA/NP OP Progress Note  08/31/2022 1:38 PM Christie Hurst  MRN:  [id number]  Chief Complaint:  Chief Complaint  Patient presents with   Follow-up   HPI:  This is a follow-up appointment for adjustment disorder and insomnia.  She states that she has worsening in the insomnia.  She sleeps at 930, and wakes up at 1:30 in the morning, usually secondary to pain.  She notices a pattern of insomnia when she had cooking.  She was able to cook only twice in the past 2 weeks.  She will recognize that she might be doing less compared to before.  Although she did discuss with her provider, she was told that nothing can be done.  She has not been able to go to the grocery when there is worsening in pain.  The Christmas was not good as they need to put her dog down.  Although she feels sad about this, it has been physically easier for her now that she takes only 1 dog.  She does not consider herself as depressed.  She denies change in appetite.  She denies SI.  She denies alcohol use or drug use. She takes clonazepam when she wakes up early in the morning for insomnia/anxiety. She has never tried to take temazepam twice, and is willing to try this instead of clonazepam.  Wt Readings from Last 3 Encounters:  08/31/22 136 lb 3.2 oz (61.8 kg)  09/27/20 134 lb 4.8 oz (60.9 kg)  06/26/20 126 lb 9.6 oz (57.4 kg)    Visit Diagnosis:    ICD-10-CM   1. Insomnia, unspecified type  G47.00     2. Adjustment disorder with anxious mood  F43.22       Past Psychiatric History: Please see initial evaluation for full details. I have reviewed the history. No updates at this time.     Past Medical History:  Past Medical History:  Diagnosis Date   Chronic back pain    Frequent UTI    d/t congential abnormality    History of DVT (deep vein thrombosis) 05/2013   Inappropriate sinus tachycardia    Phlebitis    POTS (postural orthostatic tachycardia syndrome)    Tachycardia    Urethral spasm    UTI (urinary  tract infection)     Past Surgical History:  Procedure Laterality Date   CESAREAN SECTION     x 2    LAMINECTOMY  12/2012   L5/S1   microdiscetomy  11/2012   L5/S1   SPINAL FUSION  03/2013   L5/S1   stent/DVT  05/2013   Left groin    Family Psychiatric History: Please see initial evaluation for full details. I have reviewed the history. No updates at this time.     Family History:  Family History  Problem Relation Age of Onset   Early death Mother 46   Transient ischemic attack Mother    Depression Mother    Mental illness Mother    Transient ischemic attack Father    Breast cancer Paternal Grandmother    Breast cancer Paternal Aunt    Breast cancer Paternal Aunt    Ehlers-Danlos syndrome Daughter     Social History:  Social History   Socioeconomic History   Marital status: Married    Spouse name: jamie   Number of children: 2   Years of education: Not on file   Highest education level: Bachelor's degree (e.g., BA, AB, BS)  Occupational History   Not on file  Tobacco Use   Smoking status: Never   Smokeless tobacco: Never  Vaping Use   Vaping Use: Never used  Substance and Sexual Activity   Alcohol use: Never   Drug use: Never   Sexual activity: Yes  Other Topics Concern   Not on file  Social History Narrative   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    Two children ( 15 & 80)    Social Determinants of Health   Financial Resource Strain: Low Risk  (06/09/2018)   Overall Financial Resource Strain (CARDIA)    Difficulty of Paying Living Expenses: Not hard at all  Food Insecurity: No Food Insecurity (06/09/2018)   Hunger Vital Sign    Worried About Running Out of Food in the Last Year: Never true    Ran Out of Food in the Last Year: Never true  Transportation Needs: No Transportation Needs (06/09/2018)   PRAPARE - Administrator, Civil Service (Medical): No    Lack of Transportation (Non-Medical): No  Physical Activity: Inactive  (06/22/2018)   Exercise Vital Sign    Days of Exercise per Week: 0 days    Minutes of Exercise per Session: 0 min  Stress: Stress Concern Present (06/09/2018)   [employer] of Occupational Health - Occupational Stress Questionnaire    Feeling of Stress : Very much  Social Connections: Unknown (06/09/2018)   Social Connection and Isolation Panel [NHANES]    Frequency of Communication with Friends and Family: Not on file    Frequency of Social Gatherings with Friends and Family: Not on file    Attends Religious Services: Never    Active Member of Clubs or Organizations: No    Attends Banker Meetings: Never    Marital Status: Married    Allergies:  Allergies  Allergen Reactions   Tamsulosin Hypertension, Photosensitivity, Rash and Shortness Of Breath   Tramadol Anxiety, Palpitations and Shortness Of Breath   Carbamazepine Diarrhea and Other (See Comments)   Cephalexin Hives   Duloxetine Hcl Other (See Comments)   Lamotrigine Photosensitivity   Levorphanol    Remeron [Mirtazapine]     Chest pain   Seroquel [Quetiapine Fumarate]     "out of it" it opposite of what it was suppose to do   Buprenorphine Hcl Anxiety and Palpitations   Sulfamethoxazole-Trimethoprim Hives and Palpitations    Metabolic Disorder Labs: Lab Results  Component Value Date   HGBA1C 5.2 06/16/2019   No results found for: "PROLACTIN" Lab Results  Component Value Date   CHOL 210 (H) 10/04/2020   TRIG 124.0 10/04/2020   HDL 87.50 10/04/2020   CHOLHDL 2 10/04/2020   VLDL 24.8 10/04/2020   LDLCALC 98 10/04/2020   LDLCALC 136 (H) 06/16/2019   Lab Results  Component Value Date   TSH 0.73 06/16/2019    Therapeutic Level Labs: No results found for: "LITHIUM" No results found for: "VALPROATE" No results found for: "CBMZ"  Current Medications: Current Outpatient Medications  Medication Sig Dispense Refill   acebutolol (SECTRAL) 200 MG capsule TAKE 1 CAPSULE(200 MG) BY MOUTH DAILY  90 capsule 1   amitriptyline (ELAVIL) 25 MG tablet TAKE 1 TABLET(25 MG) BY MOUTH AT BEDTIME 90 tablet 1   CALCIUM PO Take by mouth daily.     celecoxib (CELEBREX) 200 MG capsule Take 200 mg by mouth 2 (two) times daily.     clonazePAM (KLONOPIN) 0.5 MG tablet Take 0.5-1 tablets (0.25-0.5 mg total) by mouth at bedtime as  needed for anxiety. 30 tablet 1   HYDROmorphone (DILAUDID) 2 MG tablet Take 1 tablet (2 mg total) by mouth every 6 (six) hours as needed for severe pain. 20 tablet 0   nitrofurantoin (MACRODANTIN) 100 MG capsule Take 1 capsule PO daily. 90 capsule 1   norethindrone (NORLYDA) 0.35 MG tablet Take 1 tablet (0.35 mg total) by mouth daily. 84 tablet 1   temazepam (RESTORIL) 15 MG capsule Take 1 capsule (15 mg total) by mouth 2 (two) times daily. 60 capsule 1   VITAMIN D PO Take 2,000 Units by mouth.     No current facility-administered medications for this visit.     Musculoskeletal: Strength & Muscle Tone: within normal limits Gait & Station: normal Patient leans: N/A  Psychiatric Specialty Exam: Review of Systems  Blood pressure 105/68, pulse 81, temperature 99 F (37.2 C), temperature source Oral, height 5\' 4"  (1.626 m), weight 136 lb 3.2 oz (61.8 kg), SpO2 99 %.Body mass index is 23.38 kg/m.  General Appearance: Fairly Groomed  Eye Contact:  Good  Speech:  Clear and Coherent  Volume:  Normal  Mood:  Anxious  Affect:  Appropriate, Congruent, and calm  Thought Process:  Coherent  Orientation:  Full (Time, Place, and Person)  Thought Content: Logical   Suicidal Thoughts:  No  Homicidal Thoughts:  No  Memory:  Immediate;   Good  Judgement:  Good  Insight:  Good  Psychomotor Activity:  Normal  Concentration:  Concentration: Good and Attention Span: Good  Recall:  Good  Fund of Knowledge: Good  Language: Good  Akathisia:  No  Handed:  Right  AIMS (if indicated): not done  Assets:  Communication Skills Desire for Improvement  ADL's:  Intact  Cognition: WNL   Sleep:  Poor   Screenings: GAD-7    Flowsheet Row Office Visit from 08/31/2022 in [place]  Total GAD-7 Score 1      PHQ2-9    [place] Visit from 08/31/2022 in [place] Office Visit from 06/26/2020 in [place] at [facility]  PHQ-2 Total Score 0 [place] Office Visit from 08/31/2022 in [place] No Risk        Assessment and Plan:  DUCHESS ARMENDAREZ is a 47 y.o. year old female with a history of  insomnia, chronic pain syndrome, multiple back surgeries with complications, urethral spasm, congenital anomaly of the ureter, inappropriate tachycardia, who presents for follow up appointment for below.    1. Insomnia, unspecified type 2. Adjustment disorder with anxious mood She reports worsening in insomnia, and anxiety in the context of demoralization secondary to pain.  Other psychosocial stressors includes loss of her father, who suffered from COPD. She enjoys interaction with her daughter at home.  She is willing to discontinue clonazepam, and try temazepam only this time to avoid polypharmacy/to see if it works better for insomnia.  Discussed potential risk of sedation, respiratory suppression with concomitant use of opioid.  Noted that although she was willing to try SSRI/SNRI, these caused adverse reaction.  There has been no evidence of misuse of this medication.  She was able to see Dr. Michail Sermon, and will have a follow-up appointment.    Plan   Change: temazepam 15 mg at night as needed for sleep, and another 15 mg late at night for insomnia- [facility], [place] Discontinue clonazepam (was on 0.5 mg in early AM for insomnia/anxiety) Next appointment  2/23 at 8 AM, video - on amitriptyline 25 mg at night (limited benefit from higher dose/dry mouth; prescribed by urology) - on hydromorphone 2 mg up to tid prn (takes twice a day) -  She had in house utox at her PCP in Nov 2023: the results are consistent with the medication she is on. No other substances.   Past trials of medication: citalopram (jaw stiffness), sertraline (restless), fluoxetine (restless), duloxetine, mirtazapine (chest pain), Quetiapine- "sick," Baclofen (crazy dreams), hydroxyzine (hyperactive), gabapentin (drowsiness), melatonin, Ambien (middle insomnia), doxepin (palpitation/insomnia),   trazodone, temazepam, clonazepam, diazepam, lemborexant (palpitation)   I have utilized the [place] Controlled Substances Reporting System (PMP AWARxE) to confirm adherence regarding the patient's medication. My review reveals appropriate prescription fills.   This clinician has discussed the side effect associated with medication prescribed during this encounter. Please refer to notes in the previous encounters for more details.      Collaboration of Care: Collaboration of Care: Other reviewed notes in Epic  Patient/Guardian was advised Release of Information must be obtained prior to any record release in order to collaborate their care with an outside provider. Patient/Guardian was advised if they have not already done so to contact the registration department to sign all necessary forms in order for [place] to release information regarding their care.   Consent: Patient/Guardian gives verbal consent for treatment and assignment of benefits for services provided during this visit. Patient/Guardian expressed understanding and agreed to proceed.    [name], MD 08/31/2022, 1:38 PM

## 2022-08-31 ENCOUNTER — Ambulatory Visit (INDEPENDENT_AMBULATORY_CARE_PROVIDER_SITE_OTHER): Payer: Medicare Other | Admitting: Psychiatry

## 2022-08-31 ENCOUNTER — Encounter: Payer: Self-pay | Admitting: Psychiatry

## 2022-08-31 VITALS — BP 105/68 | HR 81 | Temp 99.0°F | Ht 64.0 in | Wt 136.2 lb

## 2022-08-31 DIAGNOSIS — F4322 Adjustment disorder with anxiety: Secondary | ICD-10-CM

## 2022-08-31 DIAGNOSIS — G4700 Insomnia, unspecified: Secondary | ICD-10-CM | POA: Diagnosis not present

## 2022-08-31 MED ORDER — TEMAZEPAM 15 MG PO CAPS: 15.0000 mg | ORAL_CAPSULE | Freq: Two times a day (BID) | ORAL | 1 refills | Status: DC

## 2022-08-31 NOTE — Patient Instructions (Signed)
Change: temazepam 15 mg at night as needed for sleep, and another 15 mg late at night for insomnia Discontinue clonazepam  Next appointment 2/23 at 8 AM

## 2022-09-03 ENCOUNTER — Ambulatory Visit (INDEPENDENT_AMBULATORY_CARE_PROVIDER_SITE_OTHER): Payer: Medicare Other | Admitting: Psychologist

## 2022-09-03 DIAGNOSIS — F330 Major depressive disorder, recurrent, mild: Secondary | ICD-10-CM | POA: Diagnosis not present

## 2022-09-03 NOTE — Progress Notes (Signed)
[place] Counselor/Therapist Progress Note  Patient ID: JERILEE SPACE, MRN: [id number],    Date: 09/03/2022  Time Spent: 08:04 am to 08:42 am; total time: 38 minutes   This session was held via in person. The patient consented to in-person therapy and was in the clinician's office. Limits of confidentiality were discussed with the patient.    Treatment Type: Individual Therapy  Reported Symptoms: Feeling discouraged   Mental Status Exam: Appearance:  Well Groomed     Behavior: Appropriate  Motor: Normal  Speech/Language:  Clear and Coherent  Affect: Appropriate  Mood: normal  Thought process: normal  Thought content:   WNL  Sensory/Perceptual disturbances:   WNL  Orientation: oriented to person, place, and time/date  Attention: Good  Concentration: Good  Memory: WNL  Fund of knowledge:  Good  Insight:   Good  Judgment:  Good  Impulse Control: Good   Risk Assessment: Danger to Self:  No Self-injurious Behavior: No Danger to Others: No Duty to Warn:no Physical Aggression / Violence:No  Access to Firearms a concern: No  Gang Involvement:No   Subjective: Beginning the session, patient described herself as okay. After reviewing the treatment plan, patient indicated that the family experienced a difficult holiday season as they had to put a dog down on Christmas. She also voiced that her husband may lose his job, and that the patient is feeling financially pressured to return to work. Patient spent session reflecting on different ways she has attempted to address the physiological pain she experiences. She asked to follow up. She denied suicidal and homicidal ideation.    Interventions:  Worked on developing a therapeutic relationship with the patient using active listening and reflective statements. Provided emotional support using empathy and validation. Reviewed the treatment plan with the patient. Reviewed events since the intake. Normalized and validated  thoughts. Briefly processed emotions related to the death of the dog. Identified goals for the session. Provided psychoeducation related to the patient's diagnosis. Normalized and validated expressed concerns. Processed the concerns related to financial restraints. Attempted to assist with problem solving related to the physiological pain. Discussed options for counseling briefly if patient can't afford to continue seeing this therapist. Assessed for suicidal and homicidal ideation.   Homework: Patient may look into cupping or contacting a massage school regarding free massages for students in training  Next Session: Emotional support  Diagnosis: F33.0 major depressive affective disorder, recurrent, mild   Plan:   Goals Alleviate depressive symptoms Recognize, accept, and cope with depressive feelings Develop healthy thinking patterns Develop healthy interpersonal relationships  Objectives target date for all objectives is 08/04/2023 Cooperate with a medication evaluation by a physician Verbalize an accurate understanding of depression Verbalize an understanding of the treatment Identify and replace thoughts that support depression Learn and implement behavioral strategies Verbalize an understanding and resolution of current interpersonal problems Learn and implement problem solving and decision making skills Learn and implement conflict resolution skills to resolve interpersonal problems Verbalize an understanding of healthy and unhealthy emotions verbalize insight into how past relationships may be influence current experiences with depression Use mindfulness and acceptance strategies and increase value based behavior  Increase hopeful statements about the future.  Interventions Consistent with treatment model, discuss how change in cognitive, behavioral, and interpersonal can help client alleviate depression CBT Behavioral activation help the client explore the relationship, nature  of the dispute,  Help the client develop new interpersonal skills and relationships Conduct Problem so living therapy Teach conflict resolution skills Use  a process-experiential approach Conduct TLDP Conduct ACT Evaluate need for psychotropic medication  The patient and clinician reviewed the treatment plan on 09/03/2022. The patient agreed with the treatment plan.   Conception Chancy, PsyD

## 2022-09-03 NOTE — Progress Notes (Signed)
                Makaila Windle, PsyD 

## 2022-09-17 ENCOUNTER — Other Ambulatory Visit: Payer: Self-pay | Admitting: Psychiatry

## 2022-09-17 ENCOUNTER — Telehealth: Payer: Self-pay

## 2022-09-17 MED ORDER — CLONAZEPAM 0.25 MG PO TBDP: 0.2500 mg | ORAL_TABLET | Freq: Every evening | ORAL | 0 refills | Status: DC | PRN

## 2022-09-17 NOTE — Telephone Encounter (Signed)
It's positive that she has been able to manage on a lower dose of clonazepam. I sent in 0.25 mg tablets to make it easier for her (to be filled after she runs out of the current tabs). Please advise her to maintain the current regimen until the next visit.

## 2022-09-17 NOTE — Telephone Encounter (Signed)
1/2 of a .5mg  of klonopin

## 2022-09-17 NOTE — Telephone Encounter (Signed)
Noted. Could you ask her if she is taking clonazepam 0.25 mg (half of 0.5 mg tab), or 0.5 mg?

## 2022-09-17 NOTE — Telephone Encounter (Signed)
pt states that she tried to do the temazepam 15mg  1 at bedtime and the other one when she wakes up. but the one for when she wakes up is not working so she is taking both 15mg  (2) at bedtime and if she wakes up she take a 1/2 of a klonpoin

## 2022-09-21 ENCOUNTER — Ambulatory Visit (INDEPENDENT_AMBULATORY_CARE_PROVIDER_SITE_OTHER): Payer: Medicare Other | Admitting: Psychologist

## 2022-09-21 DIAGNOSIS — F330 Major depressive disorder, recurrent, mild: Secondary | ICD-10-CM | POA: Diagnosis not present

## 2022-09-21 NOTE — Progress Notes (Signed)
[place] Counselor/Therapist Progress Note  Patient ID: Christie Hurst, MRN: [id number],    Date: 09/21/2022  Time Spent: 01:16 pm to 01:56 pm; total time: 40 minutes   This session was held via in person. The patient consented to in-person therapy and was in the clinician's office. Limits of confidentiality were discussed with the patient.    Treatment Type: Individual Therapy  Reported Symptoms: Feeling frustrated due to different stressors  Mental Status Exam: Appearance:  Well Groomed     Behavior: Appropriate  Motor: Normal  Speech/Language:  Clear and Coherent  Affect: Appropriate  Mood: normal  Thought process: normal  Thought content:   WNL  Sensory/Perceptual disturbances:   WNL  Orientation: oriented to person, place, and time/date  Attention: Good  Concentration: Good  Memory: WNL  Fund of knowledge:  Good  Insight:   Good  Judgment:  Good  Impulse Control: Good   Risk Assessment: Danger to Self:  No Self-injurious Behavior: No Danger to Others: No Duty to Warn:no Physical Aggression / Violence:No  Access to Firearms a concern: No  Gang Involvement:No   Subjective: Beginning the session, patient described herself as okay. Patient disclosed that her husband still has his job. From there, she talked about stressors including the possibility of moving back to [place], looking for new housing, and feeling financial stressors. She processed thoughts and emotions about these different stressors. She asked to follow up. She denied suicidal and homicidal ideation.    Interventions:  Worked on developing a therapeutic relationship with the patient using active listening and reflective statements. Provided emotional support using empathy and validation. Reviewed the treatment plan with the patient. Reviewed events since the intake. Used reflective statements. Identified goals for the session. Explored the etiology of stressors related to family dynamics.  Used socratic questions to assist the patient gain insight into self. Challenged some of the thoughts expressed. Assisted in using a decisional analysis. Identified several different themes in the session. Provided psychoeducation about who can fill out documentation. Validated expressed thoughts and emotions. Provided empathic statements. Attempted to assist with the idea of externalizing worries. Began to explore social support and barriers to social support. Assessed for suicidal and homicidal ideation.   Homework: Consider looking into people who can provide social support outside immediate family  Next Session: Emotional support and discuss options related to financial concerns. Process getting emotional support needs  Diagnosis: F33.0 major depressive affective disorder, recurrent, mild   Plan:   Goals Alleviate depressive symptoms Recognize, accept, and cope with depressive feelings Develop healthy thinking patterns Develop healthy interpersonal relationships  Objectives target date for all objectives is 08/04/2023 Cooperate with a medication evaluation by a physician Verbalize an accurate understanding of depression Verbalize an understanding of the treatment Identify and replace thoughts that support depression Learn and implement behavioral strategies Verbalize an understanding and resolution of current interpersonal problems Learn and implement problem solving and decision making skills Learn and implement conflict resolution skills to resolve interpersonal problems Verbalize an understanding of healthy and unhealthy emotions verbalize insight into how past relationships may be influence current experiences with depression Use mindfulness and acceptance strategies and increase value based behavior  Increase hopeful statements about the future.  Interventions Consistent with treatment model, discuss how change in cognitive, behavioral, and interpersonal can help client  alleviate depression CBT Behavioral activation help the client explore the relationship, nature of the dispute,  Help the client develop new interpersonal skills and relationships Conduct Problem so living therapy  Teach conflict resolution skills Use a process-experiential approach Conduct TLDP Conduct ACT Evaluate need for psychotropic medication  The patient and clinician reviewed the treatment plan on 09/03/2022. The patient agreed with the treatment plan.   Conception Chancy, PsyD

## 2022-09-30 ENCOUNTER — Telehealth: Payer: Self-pay

## 2022-09-30 NOTE — Telephone Encounter (Signed)
pt left a message that she wanted to r/s her appt to the end of next month because she does have the money. she states that she seeing her therapist weekly and her medication cost has went up because her insurance has hanged and she just don't have the money.

## 2022-09-30 NOTE — Telephone Encounter (Signed)
Christie Hurst- could you contact her and reschedule if available slot in late March or early April? Let me know once she is scheduled to send enough refills to last until her next appointment.

## 2022-10-01 ENCOUNTER — Other Ambulatory Visit: Payer: Self-pay | Admitting: Psychiatry

## 2022-10-01 ENCOUNTER — Ambulatory Visit: Payer: Medicare Other | Admitting: Psychologist

## 2022-10-01 MED ORDER — TEMAZEPAM 30 MG PO CAPS: 30.0000 mg | ORAL_CAPSULE | Freq: Every evening | ORAL | 1 refills | Status: DC | PRN

## 2022-10-01 NOTE — Telephone Encounter (Signed)
called pharmacy and she states that it only in capsule form so it no cutting it in half.Marland Kitchen

## 2022-10-01 NOTE — Telephone Encounter (Signed)
I think that medication only come in a capsule form. but ill call pharmacy and find out to make sure.

## 2022-10-01 NOTE — Telephone Encounter (Signed)
pt called states she needs the temazepam to be changed to a 101m because it is cheaper than the 170m she also requested that because of money and no insurance that she move her appt. appt was made for 11-13-22

## 2022-10-06 ENCOUNTER — Ambulatory Visit: Payer: Medicare Other | Admitting: Psychologist

## 2022-10-06 DIAGNOSIS — F330 Major depressive disorder, recurrent, mild: Secondary | ICD-10-CM | POA: Diagnosis not present

## 2022-10-06 NOTE — Progress Notes (Signed)
[facility] Counselor/Therapist Progress Note  Patient ID: Christie Hurst, MRN: [id number],    Date: 10/06/2022  Time Spent: 09:05 am to 09:43 am; total time: 38 minutes   This session was held via in person. The patient consented to in-person therapy and was in the clinician's office. Limits of confidentiality were discussed with the patient.    Treatment Type: Individual Therapy  Reported Symptoms: Feeling frustrated due to multiple variable and break through pain  Mental Status Exam: Appearance:  Well Groomed     Behavior: Appropriate  Motor: Normal  Speech/Language:  Clear and Coherent  Affect: Appropriate  Mood: normal  Thought process: normal  Thought content:   WNL  Sensory/Perceptual disturbances:   WNL  Orientation: oriented to person, place, and time/date  Attention: Good  Concentration: Good  Memory: WNL  Fund of knowledge:  Good  Insight:   Good  Judgment:  Good  Impulse Control: Good   Risk Assessment: Danger to Self:  No Self-injurious Behavior: No Danger to Others: No Duty to Warn:no Physical Aggression / Violence:No  Access to Firearms a concern: No  Gang Involvement:No   Subjective: Beginning the session, patient described herself as having experienced a difficult week due to pain, lack of support from spouse, and challenges with her dog. Patient initially reflected and processed frustrations with spouse, before primarily focusing on wanting to identify and use better ways to address her break through pain. She asked to follow up. She denied suicidal and homicidal ideation.    Interventions:  Worked on developing a therapeutic relationship with the patient using active listening and reflective statements. Provided emotional support using empathy and validation. Reviewed the treatment plan with the patient. Used summary statements. Validated the patient's experience while processing thoughts and emotions. Identified goals for the session.  Initially began to explore concerns related to lack of support from spouse. Attempted to assist with identifying how to minimize being active in attempts to reduce intensity of pain. Explored and processed different options for pain experienced. Processed thoughts and emotions. Provided empathic statements. Discussed next steps. Assessed for suicidal and homicidal ideation.   Homework: NA  Next Session: Emotional support and discuss options for support and limiting activity level.   Diagnosis: F33.0 major depressive affective disorder, recurrent, mild   Plan:   Goals Alleviate depressive symptoms Recognize, accept, and cope with depressive feelings Develop healthy thinking patterns Develop healthy interpersonal relationships  Objectives target date for all objectives is 08/04/2023 Cooperate with a medication evaluation by a physician Verbalize an accurate understanding of depression Verbalize an understanding of the treatment Identify and replace thoughts that support depression Learn and implement behavioral strategies Verbalize an understanding and resolution of current interpersonal problems Learn and implement problem solving and decision making skills Learn and implement conflict resolution skills to resolve interpersonal problems Verbalize an understanding of healthy and unhealthy emotions verbalize insight into how past relationships may be influence current experiences with depression Use mindfulness and acceptance strategies and increase value based behavior  Increase hopeful statements about the future.  Interventions Consistent with treatment model, discuss how change in cognitive, behavioral, and interpersonal can help client alleviate depression CBT Behavioral activation help the client explore the relationship, nature of the dispute,  Help the client develop new interpersonal skills and relationships Conduct Problem so living therapy Teach conflict resolution  skills Use a process-experiential approach Conduct TLDP Conduct ACT Evaluate need for psychotropic medication  The patient and clinician reviewed the treatment plan on 09/03/2022. The patient  agreed with the treatment plan.   Conception Chancy, PsyD

## 2022-10-09 ENCOUNTER — Telehealth: Payer: Medicare Other | Admitting: Psychiatry

## 2022-10-23 ENCOUNTER — Telehealth: Payer: Self-pay

## 2022-10-23 ENCOUNTER — Encounter: Payer: Medicaid Other | Admitting: Family Medicine

## 2022-10-23 DIAGNOSIS — G4700 Insomnia, unspecified: Secondary | ICD-10-CM

## 2022-10-23 DIAGNOSIS — F330 Major depressive disorder, recurrent, mild: Secondary | ICD-10-CM

## 2022-10-23 MED ORDER — CLONAZEPAM 0.25 MG PO TBDP: 0.2500 mg | ORAL_TABLET | Freq: Every evening | ORAL | 0 refills | Status: DC | PRN

## 2022-10-23 NOTE — Telephone Encounter (Signed)
I have send limited supply of clonazepam-15 days to her pharmacy at [facility], [place].

## 2022-10-23 NOTE — Telephone Encounter (Signed)
pt needs refills on the clonazepam .'25mg'$ . the pharamcy has to order the disintegrating tablet so please send in so they can order and have before she runs out.

## 2022-10-26 ENCOUNTER — Telehealth: Payer: Self-pay

## 2022-10-26 ENCOUNTER — Ambulatory Visit: Payer: Medicare Other | Admitting: Psychologist

## 2022-10-26 NOTE — Telephone Encounter (Signed)
pt called states that she needs a refill on the clonazepam .'25mg'$  . pt was told that [facility] [address]  [facility] the [facility]

## 2022-10-26 NOTE — Telephone Encounter (Signed)
called pharmacy. according to their system pt can not pick up until tomorrow.

## 2022-10-31 ENCOUNTER — Other Ambulatory Visit: Payer: Self-pay | Admitting: Psychiatry

## 2022-10-31 DIAGNOSIS — F330 Major depressive disorder, recurrent, mild: Secondary | ICD-10-CM

## 2022-10-31 DIAGNOSIS — G4700 Insomnia, unspecified: Secondary | ICD-10-CM

## 2022-10-31 MED ORDER — CLONAZEPAM 0.25 MG PO TBDP: 0.2500 mg | ORAL_TABLET | Freq: Every evening | ORAL | 0 refills | Status: DC | PRN

## 2022-10-31 NOTE — Telephone Encounter (Signed)
Please inform the patient that the additional refill has been sent to the pharmacy.

## 2022-11-03 ENCOUNTER — Other Ambulatory Visit: Payer: Self-pay | Admitting: Psychiatry

## 2022-11-03 ENCOUNTER — Telehealth: Payer: Self-pay | Admitting: Psychiatry

## 2022-11-03 MED ORDER — CLONAZEPAM 0.25 MG PO TBDP: 0.2500 mg | ORAL_TABLET | Freq: Every day | ORAL | 1 refills | Status: DC

## 2022-11-03 MED ORDER — TEMAZEPAM 30 MG PO CAPS: 30.0000 mg | ORAL_CAPSULE | Freq: Every evening | ORAL | 1 refills | Status: AC | PRN

## 2022-11-03 NOTE — Telephone Encounter (Signed)
I've arranged refills for both clonazepam and temazepam to last until June, considering the circumstances. Please let her know that I won't be able to provide any further refills without an evaluation, especially since I haven't seen her since January. Wishing her all the best.

## 2022-11-03 NOTE — Telephone Encounter (Signed)
Patient called stating new insurance will not cover any of her physicians including our office. She has to find new physicians and is asking if you will prescribe her medication to help bridge the gap till she can find a new psychiatrist in her network for coverage.

## 2022-11-04 NOTE — Telephone Encounter (Signed)
Left message that rx was sent to pharmacy 

## 2022-11-13 ENCOUNTER — Telehealth: Payer: Medicare Other | Admitting: Psychiatry

## 2022-11-16 ENCOUNTER — Ambulatory Visit: Payer: Medicare Other | Admitting: Psychologist

## 2023-12-08 ENCOUNTER — Encounter: Payer: Self-pay | Admitting: Cardiovascular Disease

## 2023-12-08 ENCOUNTER — Ambulatory Visit: Attending: Cardiovascular Disease | Admitting: Cardiovascular Disease

## 2023-12-08 VITALS — BP 108/62 | HR 85 | Ht 64.0 in | Wt 145.0 lb

## 2023-12-08 DIAGNOSIS — I871 Compression of vein: Secondary | ICD-10-CM | POA: Diagnosis not present

## 2023-12-08 DIAGNOSIS — G90A Postural orthostatic tachycardia syndrome (POTS): Secondary | ICD-10-CM | POA: Diagnosis not present

## 2023-12-08 DIAGNOSIS — R600 Localized edema: Secondary | ICD-10-CM | POA: Insufficient documentation

## 2023-12-08 NOTE — Assessment & Plan Note (Signed)
 History of pots disease controlled on Sectral 

## 2023-12-08 NOTE — Assessment & Plan Note (Addendum)
 Bilateral lower extremity edema which began after starting gabapentin .  Apparently, according to the literature, between 2 and 8% of patients being gone on Gabapentin  have lower extremity edema.  She has mild ankle edema.  She has no heart failure symptoms otherwise.  She tells me that her pain management physician says that this is a common side effect of this drug.  I am going to get a 2D echo to further evaluate.

## 2023-12-08 NOTE — Assessment & Plan Note (Signed)
 History of May Thurner syndrome status post left common iliac vein stent in [place]  in 2014.  She is hypercoagulable with prothrombin 2 deficiency and was on Coumadin and Xarelto for a year after her DVT/pulmonary embolism in 2014.

## 2023-12-08 NOTE — Progress Notes (Signed)
 12/08/2023 Christie Hurst   03-03-1976  [id number]  Primary Physician Joenathan Muslim, FNP Primary Cardiologist: Avanell Leigh MD Bennye Bravo, [place]  HPI:  Christie Hurst is a 48 y.o. thin-appearing married Caucasian female mother of 2 children who is currently disabled because of back issues.  She was referred by her OB/GYN because of lower extremity edema.  She has no cardiac risk factors.  There is no family history for heart disease.  She never had heart attack or stroke.  She does have a history of inappropriate sinus tachycardia versus POTS disease and has been treated by cardiologist in the past and is currently on Sectral  which controls her tachycardia.  She denies chest pain or shortness of breath.  She did have 3 back surgeries in [place]  back in 2014 because of a herniated disc and ended up having a DVT/PE.  Ultimately she was found to have "May-Thurner syndrome" and ended up having a left common iliac vein stent at that time and was on Coumadin and Xarelto for up to 1 year.  She has had no further clotting issues.  She has chronic pain in her left lower extremity.  She was started on gabapentin  after which she noticed bilateral lower extremity edema.   Current Meds  Medication Sig   acebutolol  (SECTRAL ) 200 MG capsule TAKE 1 CAPSULE(200 MG) BY MOUTH DAILY   amitriptyline  (ELAVIL ) 25 MG tablet TAKE 1 TABLET(25 MG) BY MOUTH AT BEDTIME   celecoxib (CELEBREX) 200 MG capsule Take 200 mg by mouth 2 (two) times daily.   gabapentin  (NEURONTIN ) 300 MG capsule Take 300 mg by mouth 3 (three) times daily.   HYDROmorphone  (DILAUDID ) 2 MG tablet Take 1 tablet (2 mg total) by mouth every 6 (six) hours as needed for severe pain.   nitrofurantoin  (MACRODANTIN ) 100 MG capsule Take 1 capsule PO daily.   norethindrone  (NORLYDA ) 0.35 MG tablet Take 1 tablet (0.35 mg total) by mouth daily.   VITAMIN D PO Take 2,000 Units by mouth.     Allergies  Allergen Reactions   Tamsulosin  Hypertension, Photosensitivity, Rash and Shortness Of Breath   Tramadol Anxiety, Palpitations and Shortness Of Breath   Carbamazepine Diarrhea and Other (See Comments)   Cephalexin Hives   Duloxetine Hcl Other (See Comments)   Lamotrigine Photosensitivity   Levorphanol    Remeron  [Mirtazapine ]     Chest pain   Seroquel  [Quetiapine  Fumarate]     "out of it" it opposite of what it was suppose to do   Buprenorphine Hcl Anxiety and Palpitations   Sulfamethoxazole-Trimethoprim Hives and Palpitations    Social History   Socioeconomic History   Marital status: Married    Spouse name: jamie   Number of children: 2   Years of education: Not on file   Highest education level: Bachelor's degree (e.g., BA, AB, BS)  Occupational History   Not on file  Tobacco Use   Smoking status: Never   Smokeless tobacco: Never  Vaping Use   Vaping status: Never Used  Substance and Sexual Activity   Alcohol use: Never   Drug use: Never   Sexual activity: Yes  Other Topics Concern   Not on file  Social History Narrative   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    Two children ( 15 & 52)    Social Drivers of Corporate investment banker Strain: Medium Risk (11/22/2022)   Received from [facility]  Overall Financial Resource Strain (CARDIA)    Difficulty of Paying Living Expenses: Somewhat hard  Food Insecurity: Unknown (10/25/2023)   Received from [facility]   Hunger Vital Sign    Worried About Running Out of Food in the Last Year: Patient declined to answer    Ran Out of Food in the Last Year: Patient declined to answer  Transportation Needs: No Transportation Needs (10/25/2023)   Received from [facility]    In the past 12 months, has lack of reliable transportation kept you from medical appointments, meetings, work or from getting things needed for daily living? : No  Physical Activity: Unknown (11/22/2022)   [facility]   Exercise Vital Sign     Days of Exercise per Week: 0 days    Minutes of Exercise per Session: Not on file  Stress: Stress Concern Present (11/22/2022)   Received from [facility] - Occupational Stress Questionnaire    Feeling of Stress : Very much  Social Connections: Somewhat Isolated (11/22/2022)   Received from [facility]   Social Network    How would you rate your social network (family, work, friends)?: Restricted participation with some degree of social isolation  Intimate Partner Violence: Not At Risk (11/22/2022)   Received from [facility]   HITS    Over the last 12 months how often did your partner physically hurt you?: Never    Over the last 12 months how often did your partner insult you or talk down to you?: Never    Over the last 12 months how often did your partner threaten you with physical harm?: Never    Over the last 12 months how often did your partner scream or curse at you?: Never     Review of Systems: General: negative for chills, fever, night sweats or weight changes.  Cardiovascular: negative for chest pain, dyspnea on exertion, edema, orthopnea, palpitations, paroxysmal nocturnal dyspnea or shortness of breath Dermatological: negative for rash Respiratory: negative for cough or wheezing Urologic: negative for hematuria Abdominal: negative for nausea, vomiting, diarrhea, bright red blood per rectum, melena, or hematemesis Neurologic: negative for visual changes, syncope, or dizziness All other systems reviewed and are otherwise negative except as noted above.    Blood pressure 108/62, pulse 85, height 5\' 4"  (1.626 m), weight 145 lb (65.8 kg), SpO2 98%.  General appearance: alert and no distress Neck: no adenopathy, no carotid bruit, no JVD, supple, symmetrical, trachea midline, and thyroid  not enlarged, symmetric, no tenderness/mass/nodules Lungs: clear to auscultation bilaterally Heart: regular rate and rhythm, S1, S2 normal, no  murmur, click, rub or gallop Extremities: extremities normal, atraumatic, no cyanosis or edema Pulses: 2+ and symmetric Skin: Skin color, texture, turgor normal. No rashes or lesions Neurologic: Grossly normal  EKG EKG Interpretation Date/Time:  Wednesday December 08 2023 09:39:45 EDT Ventricular Rate:  85 PR Interval:  96 QRS Duration:  70 QT Interval:  334 QTC Calculation: 397 R Axis:   80  Text Interpretation: Sinus rhythm with short PR Nonspecific ST and T wave abnormality No previous ECGs available Confirmed by Lauro Portal [phone]) on 12/08/2023 9:52:52 AM    ASSESSMENT AND PLAN:   POTS (postural orthostatic tachycardia syndrome) History of pots disease controlled on Sectral   May-Thurner syndrome History of May Thurner syndrome status post left common iliac vein stent in [place]  in 2014.  She is hypercoagulable with prothrombin 2 deficiency and was on Coumadin and Xarelto  for a year after her DVT/pulmonary embolism in 2014.  Bilateral lower extremity edema Bilateral lower extremity edema which began after starting gabapentin .  Apparently, according to the literature, between 2 and 8% of patients being gone on Gabapentin  have lower extremity edema.  She has mild ankle edema.  She has no heart failure symptoms otherwise.  She tells me that her pain management physician says that this is a common side effect of this drug.  I am going to get a 2D echo to further evaluate.     Avanell Leigh MD FACP,FACC,FAHA, [facility] 12/08/2023 10:09 AM

## 2023-12-08 NOTE — Patient Instructions (Signed)
 Medication Instructions:  Your physician recommends that you continue on your current medications as directed. Please refer to the Current Medication list given to you today.  *If you need a refill on your cardiac medications before your next appointment, please call your pharmacy*   Testing/Procedures: Your physician has requested that you have an echocardiogram. Echocardiography is a painless test that uses sound waves to create images of your heart. It provides your doctor with information about the size and shape of your heart and how well your heart's chambers and valves are working. This procedure takes approximately one hour. There are no restrictions for this procedure. Please do NOT wear cologne, perfume, aftershave, or lotions (deodorant is allowed). Please arrive 15 minutes prior to your appointment time.  Please note: We ask at that you not bring children with you during ultrasound (echo/ vascular) testing. Due to room size and safety concerns, children are not allowed in the ultrasound rooms during exams. Our front office staff cannot provide observation of children in our lobby area while testing is being conducted. An adult accompanying a patient to their appointment will only be allowed in the ultrasound room at the discretion of the ultrasound technician under special circumstances. We apologize for any inconvenience.   Follow-Up: At [facility], you and your health needs are our priority.  As part of our continuing mission to provide you with exceptional heart care, our providers are all part of one team.  This team includes your primary Cardiologist (physician) and Advanced Practice Providers or APPs (Physician Assistants and Nurse Practitioners) who all work together to provide you with the care you need, when you need it.  Your next appointment:   We will see you on an as needed basis    Provider:   Lauro Portal, MD    We recommend signing up for the patient  portal called "MyChart".  Sign up information is provided on this After Visit Summary.  MyChart is used to connect with patients for Virtual Visits (Telemedicine).  Patients are able to view lab/test results, encounter notes, upcoming appointments, etc.  Non-urgent messages can be sent to your provider as well.   To learn more about what you can do with MyChart, go to [email or web address].   Other Instructions       1st Floor: - Lobby - Registration  - Pharmacy  - Lab - Cafe  2nd Floor: - PV Lab - Diagnostic Testing (echo, CT, nuclear med)  3rd Floor: - Vacant  4th Floor: - TCTS (cardiothoracic surgery) - AFib Clinic - Structural Heart Clinic - Vascular Surgery  - Vascular Ultrasound  5th Floor: - HeartCare Cardiology (general and EP) - Clinical Pharmacy for coumadin, hypertension, lipid, weight-loss medications, and med management appointments    Valet parking services will be available as well.

## 2023-12-17 ENCOUNTER — Ambulatory Visit (HOSPITAL_COMMUNITY): Attending: Cardiology

## 2023-12-17 DIAGNOSIS — I871 Compression of vein: Secondary | ICD-10-CM | POA: Insufficient documentation

## 2023-12-17 DIAGNOSIS — R600 Localized edema: Secondary | ICD-10-CM | POA: Insufficient documentation

## 2023-12-17 DIAGNOSIS — G90A Postural orthostatic tachycardia syndrome (POTS): Secondary | ICD-10-CM | POA: Diagnosis present

## 2023-12-18 LAB — ECHOCARDIOGRAM COMPLETE
Area-P 1/2: 3.82 cm2
P 1/2 time: 243 ms
S' Lateral: 2.5 cm

## 2024-08-19 NOTE — Progress Notes (Unsigned)
 Virtual Visit via Video Note  I connected with Christie Hurst on 08/22/2024 at  1:30 PM EST by a video enabled telemedicine application and verified that I am speaking with the correct person using two identifiers.  Location: Patient: home Provider: office Persons participated in the visit- patient, provider    I discussed the limitations of evaluation and management by telemedicine and the availability of in person appointments. The patient expressed understanding and agreed to proceed.   I discussed the assessment and treatment plan with the patient. The patient was provided an opportunity to ask questions and all were answered. The patient agreed with the plan and demonstrated an understanding of the instructions.   The patient was advised to call back or seek an in-person evaluation if the symptoms worsen or if the condition fails to improve as anticipated.   Katheren Sleet, MD    [facility] MD/PA/NP OP Progress Note  08/22/2024 4:35 PM Christie Hurst  MRN:  [id number]  Chief Complaint:  Chief Complaint  Patient presents with   Anxiety   HPI:  - She is referred for adjustment disorder, anxiety.  - she was seen by this writer last in Jan 2024 for insomnia, adjustment disorder with anxious mood.  Chart reviewed.  According to her PCP, her pain management specialist recommended psychiatry consultation to assist with processing her condition.  - she is seen by cardiologist for lower extremity edema. She is on Sectral  for POTS. Cardiac us  was scheduled for leg edema.   BP 93/73  Pulse 85  Temp 97.9 F (36.6 C)  Ht 1.626 m (5' 4)  Wt 64.7 kg (142 lb 9.6 oz)  SpO2 98%  BMI 24.48 kg/m  06/2024  She states that she moved to [place]  about in a year ago for more opportunity, and came back, partly due to financial strain.  Her insurance did not cover this provider, but she is now on the new insurance.  She believes her primary care might not feel comfortable continuing temazepam , and she  was advised to be seen by this clinical research associate, although she was not sure if it was needed.  She states that  her dog passed away in Mar 16, 2024.  She lost her father a few years ago.  She was feeling so lonely.  Her husband is working at nights, and daughter works a lot.  However, they started to have a puppy who is 35 months old.  Except that the puppy was attacked by the neighbors dog, it has been a positive change.  She identified herself as caretaker naturally.  She also reports better communication with her husband since working on therapy.  She also finds it helpful to set a boundary with her stepmother.  She reports feeling very anxious before going to [place]  as there is tension with her stepmother and in-laws.  She states that she feels never close with her stepmother.  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  The stepmother did not allow her to spend time with her father by themselves.  She states that the stay was much better as her daughter had a place.  She reports good connection with her sister in [place] .   Insomnia-she continues to struggle with insomnia.  She sleeps 3 hours due to pain.  She is prescribed prednisone  for her pain, which allows her to sleep up to 6 hours.  She asks if quetiapine  can be considered.  Discussed at length about his possible adverse reaction, although this  could be considered in the future if any concern about her mood symptoms.   Depression-she states that it was more rough when she lost her dog.  Although she still thinks about him, she does not cry as much.  She reports good appetite. She reports weight gain since being on gabapentin . She denies anhedonia, and is interested in doing things such as reading.  She denies SI, HI, hallucinations.   Pain-she continues to struggle with pain.  She can do 1-2 thing in a day she has challenges with going to the grocery store, taking a shower or cooking.    Wt Readings from Last 3 Encounters:  12/08/23 145 lb  (65.8 kg)  08/31/22 136 lb 3.2 oz (61.8 kg)  09/27/20 134 lb 4.8 oz (60.9 kg)     Visit Diagnosis:    ICD-10-CM   1. Insomnia, unspecified type  G47.00     2. Adjustment disorder with mixed anxiety and depressed mood  F43.23       Past Psychiatric History: Please see initial evaluation for full details. I have reviewed the history. No updates at this time.     Past Medical History:  Past Medical History:  Diagnosis Date   Chronic back pain    Frequent UTI    d/t congential abnormality    History of DVT (deep vein thrombosis) 05/2013   Inappropriate sinus tachycardia    Phlebitis    POTS (postural orthostatic tachycardia syndrome)    Tachycardia    Urethral spasm    UTI (urinary tract infection)     Past Surgical History:  Procedure Laterality Date   CESAREAN SECTION     x 2    LAMINECTOMY  12/2012   L5/S1   microdiscetomy  11/2012   L5/S1   SPINAL FUSION  03/2013   L5/S1   stent/DVT  05/2013   Left groin    Family Psychiatric History: Please see initial evaluation for full details. I have reviewed the history. No updates at this time.     Family History:  Family History  Problem Relation Age of Onset   Early death Mother 36   Transient ischemic attack Mother    Depression Mother    Mental illness Mother    Transient ischemic attack Father    Breast cancer Paternal Grandmother    Breast cancer Paternal Aunt    Breast cancer Paternal Aunt    Ehlers-Danlos syndrome Daughter     Social History:  Social History   Socioeconomic History   Marital status: Married    Spouse name: jamie   Number of children: 2   Years of education: Not on file   Highest education level: Bachelor's degree (e.g., BA, AB, BS)  Occupational History   Not on file  Tobacco Use   Smoking status: Never   Smokeless tobacco: Never  Vaping Use   Vaping status: Never Used  Substance and Sexual Activity   Alcohol use: Never   Drug use: Never   Sexual activity: Yes  Other  Topics Concern   Not on file  Social History Narrative   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    Two children ( 15 & 40)    Social Drivers of Health   Tobacco Use: Low Risk (08/22/2024)   Patient History    Smoking Tobacco Use: Never    Smokeless Tobacco Use: Never    Passive Exposure: Not on file  Financial Resource Strain: Medium Risk (11/22/2022)  Received from [facility]   Overall Financial Resource Strain (CARDIA)    Difficulty of Paying Living Expenses: Somewhat hard  Food Insecurity: Low Risk (06/19/2024)   Received from [facility]   Epic    Within the past 12 months, you worried that your food would run out before you got money to buy more: Never true    Within the past 12 months, the food you bought just didn't last and you didn't have money to get more. : Never true  Transportation Needs: No Transportation Needs (06/19/2024)   Received from [facility]    In the past 12 months, has lack of reliable transportation kept you from medical appointments, meetings, work or from getting things needed for daily living? : No  Physical Activity: Unknown (11/22/2022)   Received from [facility]   Exercise Vital Sign    On average, how many days per week do you engage in moderate to strenuous exercise (like a brisk walk)?: 0 days    Minutes of Exercise per Session: Not on file  Stress: Stress Concern Present (11/22/2022)   Received from [facility] - Occupational Stress Questionnaire    Feeling of Stress : Very much  Social Connections: Somewhat Isolated (11/22/2022)   Received from [facility]   Social Network    How would you rate your social network (family, work, friends)?: Restricted participation with some degree of social isolation  Depression (PHQ2-9): Low Risk (08/31/2022)   Depression (PHQ2-9)    PHQ-2 Score: 0  Alcohol Screen: Not on file  Housing: Low Risk (06/19/2024)   Received from [facility]    Epic    What is your living situation today?: I have a steady place to live    Think about the place you live. Do you have problems with any of the following? Choose all that apply:: None/None on this list  Utilities: Low Risk (06/19/2024)   Received from [facility]   Utilities    In the past 12 months has the electric, gas, oil, or water company threatened to shut off services in your home? : No  Health Literacy: Not on file    Allergies: Allergies[1]  Metabolic Disorder Labs: Lab Results  Component Value Date   HGBA1C 5.2 06/16/2019   No results found for: PROLACTIN Lab Results  Component Value Date   CHOL 210 (H) 10/04/2020   TRIG 124.0 10/04/2020   HDL 87.50 10/04/2020   CHOLHDL 2 10/04/2020   VLDL 24.8 10/04/2020   LDLCALC 98 10/04/2020   LDLCALC 136 (H) 06/16/2019   Lab Results  Component Value Date   TSH 0.73 06/16/2019    Therapeutic Level Labs: No results found for: LITHIUM No results found for: VALPROATE No results found for: CBMZ  Current Medications: Current Outpatient Medications  Medication Sig Dispense Refill   acebutolol  (SECTRAL ) 200 MG capsule TAKE 1 CAPSULE(200 MG) BY MOUTH DAILY 90 capsule 1   amitriptyline  (ELAVIL ) 25 MG tablet TAKE 1 TABLET(25 MG) BY MOUTH AT BEDTIME 90 tablet 1   celecoxib (CELEBREX) 200 MG capsule Take 200 mg by mouth 2 (two) times daily.     gabapentin  (NEURONTIN ) 300 MG capsule Take 300 mg by mouth 3 (three) times daily.     HYDROmorphone  (DILAUDID ) 2 MG tablet Take 1 tablet (2 mg total) by mouth every 6 (six) hours as needed for severe pain. 20 tablet 0   nitrofurantoin  (MACRODANTIN ) 100 MG capsule Take 1  capsule PO daily. 90 capsule 1   norethindrone  (NORLYDA ) 0.35 MG tablet Take 1 tablet (0.35 mg total) by mouth daily. 84 tablet 1   temazepam  (RESTORIL ) 30 MG capsule Take 1 capsule (30 mg total) by mouth at bedtime as needed for sleep. 30 capsule 1   VITAMIN D PO Take 2,000 Units by mouth.     No current  facility-administered medications for this visit.     Musculoskeletal: Strength & Muscle Tone: N/A Gait & Station: N/A Patient leans: N/A  Psychiatric Specialty Exam: Review of Systems  Psychiatric/Behavioral:  Positive for sleep disturbance. Negative for agitation, behavioral problems, confusion, decreased concentration, dysphoric mood, hallucinations, self-injury and suicidal ideas. The patient is nervous/anxious. The patient is not hyperactive.   All other systems reviewed and are negative.   There were no vitals taken for this visit.There is no height or weight on file to calculate BMI.  General Appearance: Well Groomed  Eye Contact:  Good  Speech:  Clear and Coherent  Volume:  Normal  Mood:  better  Affect:  Appropriate, Congruent, and calm  Thought Process:  Coherent  Orientation:  Full (Time, Place, and Person)  Thought Content: Logical   Suicidal Thoughts:  No  Homicidal Thoughts:  No  Memory:  Immediate;   Good  Judgement:  Good  Insight:  Good  Psychomotor Activity:  Normal  Concentration:  Concentration: Good and Attention Span: Good  Recall:  Good  Fund of Knowledge: Good  Language: Good  Akathisia:  No  Handed:  Right  AIMS (if indicated): not done  Assets:  Communication Skills Desire for Improvement  ADL's:  Intact  Cognition: WNL  Sleep:  Poor   Screenings: GAD-7    Flowsheet Row Office Visit from [facility]/31/2022 in [facility]  Total GAD-7 Score 1   PHQ2-9    Flowsheet Row Office Visit from 08/31/2022 in [facility] Office Visit from 06/26/2020 in [facility] [place]  PHQ-2 Total Score 0 0   Flowsheet Row Office Visit from 08/31/2022 in [facility]  C-SSRS RISK CATEGORY No Risk     Assessment and Plan:  Christie Hurst is a 49 year old female with a history of  insomnia, chronic pain syndrome, multiple  back surgeries with complications, urethral spasm, congenital anomaly of the ureter, inappropriate tachycardia vs POTS, who is referred for adjustment disorder, insomnia. She is seen last in Jan 2024.    1. Insomnia, unspecified type She continues to experience middle insomnia related to pain.  It is noted that she is able to sleep longer when she receives treatment with prednisone  for pain.  Although she reports interest in trying quetiapine , there is concerning adverse reaction of metabolic side effect. She expressed understanding that although this medication could be a good option in the future, we will hold it at this time given her mood is overall stable/ to reduce the possible adverse reaction especially given she experiences weigh gain since being on gabapentin .  Psychoeducation is also provided again regarding the possible risk of respiratory suppression with concomitant use of temazepam , hydromorphone , gabapentin .   2. Adjustment disorder with mixed anxiety and depressed mood - She had back surgery in 2014, and the course was complicated DVT/PE/she was in ICU for one year. She is unemployed, although she used to work as a engineer, civil (consulting). Her mother left when she was 61 year old. She reports conflict with her step mother,  who did not allow her to be with her father. She lost her father a few years ago, and lost her dog.  Although she reports worsening in depressive symptoms when she lost her dog several months ago, it has been overall improving since the last month.  She enjoys take care of puppy, and reports  better relationship with her husband.  She is engaged very well with weekly therapy, and is attending online group for people with pain, facilitated by therapist.  Given her overall mood improvement and reduced symptom severity, pharmacological treatment is not recommended at this time, particularly in light of her prior adverse reactions to psychotropic medications.    Discussed regarding the follow  up options. She expressed understanding that she will discuss with her PCP regarding the above recommendation.  She will contact us  to make a follow-up IP appointment if her PCP does not feel comfortable continuing temazepam .   Plan   Continue temazepam  30 mg at night  Next appointment: as needed (she will contact us  if her PCP does not prescribe temazepam ) - on amitriptyline  25 mg at night (limited benefit from higher dose/dry mouth; prescribed by urology) - on hydromorphone  2 mg up to tid prn (takes twice a day), gabapentin  300 mg TID, 1-3 qhs 6 caps per day/memory issues - she sees a therapist one a week   Past trials of medication: citalopram (jaw stiffness), sertraline (restless), fluoxetine (restless), duloxetine, mirtazapine  (chest pain), Quetiapine - sick (however, she may have not tried this medication before) Baclofen (crazy dreams), hydroxyzine (hyperactive), gabapentin  (drowsiness), melatonin, Ambien (middle insomnia), doxepin  (palpitation/insomnia),   trazodone, temazepam , clonazepam , diazepam, lemborexant  (palpitation)  A total of 60 minutes was spent on the following activities during the encounter date, which includes but is not limited to: preparing to see the patient (e.g., reviewing tests and records), obtaining and/or reviewing separately obtained history, performing a medically necessary examination or evaluation, counseling and educating the patient, family, or caregiver, ordering medications, tests, or procedures, referring and communicating with other healthcare professionals (when not reported separately), documenting clinical information in the electronic or paper health record, independently interpreting test or lab results and communicating these results to the family or caregiver, and coordinating care (when not reported separately).   Collaboration of Care: Collaboration of Care: Other reviewed notes in Epic  Patient/Guardian was advised Release of Information must be  obtained prior to any record release in order to collaborate their care with an outside provider. Patient/Guardian was advised if they have not already done so to contact the registration department to sign all necessary forms in order for us  to release information regarding their care.   Consent: Patient/Guardian gives verbal consent for treatment and assignment of benefits for services provided during this visit. Patient/Guardian expressed understanding and agreed to proceed.    Katheren Sleet, MD 08/22/2024, 4:35 PM     [1]  Allergies Allergen Reactions   Tamsulosin Hypertension, Photosensitivity, Rash and Shortness Of Breath   Tramadol Anxiety, Palpitations and Shortness Of Breath   Carbamazepine Diarrhea and Other (See Comments)   Cephalexin Hives   Duloxetine Hcl Other (See Comments)   Lamotrigine Photosensitivity   Levorphanol    Remeron  [Mirtazapine ]     Chest pain   Seroquel  [Quetiapine  Fumarate]     out of it it opposite of what it was suppose to do   Buprenorphine Hcl Anxiety and Palpitations   Sulfamethoxazole-Trimethoprim Hives and Palpitations

## 2024-08-22 ENCOUNTER — Encounter: Payer: Self-pay | Admitting: Psychiatry

## 2024-08-22 ENCOUNTER — Ambulatory Visit (INDEPENDENT_AMBULATORY_CARE_PROVIDER_SITE_OTHER): Admitting: Psychiatry

## 2024-08-22 DIAGNOSIS — G4700 Insomnia, unspecified: Secondary | ICD-10-CM | POA: Diagnosis not present

## 2024-08-22 DIAGNOSIS — F4323 Adjustment disorder with mixed anxiety and depressed mood: Secondary | ICD-10-CM
